# Patient Record
Sex: Male | Born: 1975 | Race: Black or African American | Hispanic: No | Marital: Single | Smoking: Current some day smoker
Health system: Southern US, Community
[De-identification: ages and names within clinical notes are randomized; demographics above are authoritative.]

## PROBLEM LIST (undated history)

## (undated) DIAGNOSIS — W3400XA Accidental discharge from unspecified firearms or gun, initial encounter: Secondary | ICD-10-CM

## (undated) DIAGNOSIS — F319 Bipolar disorder, unspecified: Secondary | ICD-10-CM

## (undated) DIAGNOSIS — B20 Human immunodeficiency virus [HIV] disease: Secondary | ICD-10-CM

## (undated) HISTORY — PX: OTHER SURGICAL HISTORY: SHX169

---

## 2013-08-08 ENCOUNTER — Emergency Department (HOSPITAL_COMMUNITY)
Admission: EM | Admit: 2013-08-08 | Discharge: 2013-08-08 | Disposition: A | Discharge status: Home / Self Care | Payer: Self-pay | Attending: Emergency Medicine | Admitting: Emergency Medicine

## 2013-08-08 ENCOUNTER — Encounter (HOSPITAL_COMMUNITY): Payer: Self-pay | Admitting: Adult Health

## 2013-08-08 DIAGNOSIS — F17200 Nicotine dependence, unspecified, uncomplicated: Secondary | ICD-10-CM | POA: Insufficient documentation

## 2013-08-08 DIAGNOSIS — Z87828 Personal history of other (healed) physical injury and trauma: Secondary | ICD-10-CM | POA: Insufficient documentation

## 2013-08-08 DIAGNOSIS — Z202 Contact with and (suspected) exposure to infections with a predominantly sexual mode of transmission: Secondary | ICD-10-CM | POA: Insufficient documentation

## 2013-08-08 HISTORY — DX: Accidental discharge from unspecified firearms or gun, initial encounter: W34.00XA

## 2013-08-08 MED ORDER — METRONIDAZOLE 500 MG PO TABS
2000.0000 mg | ORAL_TABLET | Freq: Once | ORAL | Status: AC
  Administered 2013-08-08: 2000 mg via ORAL
  Filled 2013-08-08: qty 4

## 2013-08-08 NOTE — ED Notes (Signed)
The pt wants to be checked for a std no distress

## 2013-08-08 NOTE — ED Notes (Signed)
Requesting STD check for trichamonas. Wife was recentlyu DX.
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
## 2013-08-09 NOTE — ED Provider Notes (Signed)
Medical screening examination/treatment/procedure(s) were performed by non-physician practitioner and as supervising physician I was immediately available for consultation/collaboration.  Brandt Loosen, MD 08/09/13 [phone]

## 2013-10-08 ENCOUNTER — Emergency Department (INDEPENDENT_AMBULATORY_CARE_PROVIDER_SITE_OTHER)
Admission: EM | Admit: 2013-10-08 | Discharge: 2013-10-08 | Disposition: A | Discharge status: Home / Self Care | Payer: Self-pay | Source: Home / Self Care | Attending: Family Medicine | Admitting: Family Medicine

## 2013-10-08 ENCOUNTER — Encounter (HOSPITAL_COMMUNITY): Payer: Self-pay | Admitting: Emergency Medicine

## 2013-10-08 DIAGNOSIS — M79609 Pain in unspecified limb: Secondary | ICD-10-CM

## 2013-10-08 DIAGNOSIS — M79604 Pain in right leg: Secondary | ICD-10-CM

## 2013-10-08 MED ORDER — TRAMADOL HCL 50 MG PO TABS: 50.0000 mg | ORAL_TABLET | Freq: Three times a day (TID) | ORAL | Status: DC | PRN
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
## 2013-10-11 NOTE — ED Provider Notes (Signed)
Medical screening examination/treatment/procedure(s) were performed by a resident physician or non-physician practitioner and as the supervising physician I was immediately available for consultation/collaboration.  Clementeen Graham, MD    Rodolph Bong, MD 10/11/13 0900

## 2013-12-10 HISTORY — PX: HAND SURGERY: SHX662

## 2014-03-07 ENCOUNTER — Emergency Department (HOSPITAL_COMMUNITY): Payer: No Typology Code available for payment source

## 2014-03-07 ENCOUNTER — Emergency Department (HOSPITAL_COMMUNITY)
Admission: EM | Admit: 2014-03-07 | Discharge: 2014-03-07 | Disposition: A | Discharge status: Home / Self Care | Payer: Self-pay | Attending: Emergency Medicine | Admitting: Emergency Medicine

## 2014-03-07 ENCOUNTER — Encounter (HOSPITAL_COMMUNITY): Payer: Self-pay | Admitting: Emergency Medicine

## 2014-03-07 DIAGNOSIS — Z888 Allergy status to other drugs, medicaments and biological substances status: Secondary | ICD-10-CM | POA: Insufficient documentation

## 2014-03-07 DIAGNOSIS — F319 Bipolar disorder, unspecified: Secondary | ICD-10-CM | POA: Insufficient documentation

## 2014-03-07 DIAGNOSIS — M549 Dorsalgia, unspecified: Secondary | ICD-10-CM | POA: Insufficient documentation

## 2014-03-07 DIAGNOSIS — Y939 Activity, unspecified: Secondary | ICD-10-CM | POA: Insufficient documentation

## 2014-03-07 DIAGNOSIS — S139XXA Sprain of joints and ligaments of unspecified parts of neck, initial encounter: Secondary | ICD-10-CM | POA: Insufficient documentation

## 2014-03-07 DIAGNOSIS — S6980XA Other specified injuries of unspecified wrist, hand and finger(s), initial encounter: Secondary | ICD-10-CM | POA: Insufficient documentation

## 2014-03-07 DIAGNOSIS — F17200 Nicotine dependence, unspecified, uncomplicated: Secondary | ICD-10-CM | POA: Insufficient documentation

## 2014-03-07 DIAGNOSIS — S6990XA Unspecified injury of unspecified wrist, hand and finger(s), initial encounter: Principal | ICD-10-CM | POA: Insufficient documentation

## 2014-03-07 DIAGNOSIS — M79644 Pain in right finger(s): Secondary | ICD-10-CM

## 2014-03-07 DIAGNOSIS — S161XXA Strain of muscle, fascia and tendon at neck level, initial encounter: Secondary | ICD-10-CM

## 2014-03-07 HISTORY — DX: Bipolar disorder, unspecified: F31.9

## 2014-03-07 MED ORDER — HYDROCODONE-ACETAMINOPHEN 5-325 MG PO TABS
1.0000 | ORAL_TABLET | Freq: Once | ORAL | Status: AC
  Administered 2014-03-07: 1 via ORAL
  Filled 2014-03-07: qty 1

## 2014-03-07 MED ORDER — OXYCODONE-ACETAMINOPHEN 5-325 MG PO TABS: 1.0000 | ORAL_TABLET | ORAL | Status: DC | PRN

## 2014-03-07 MED ORDER — METHOCARBAMOL 500 MG PO TABS: 500.0000 mg | ORAL_TABLET | Freq: Two times a day (BID) | ORAL | Status: DC

## 2014-03-07 MED ORDER — HYDROCODONE-ACETAMINOPHEN 5-325 MG PO TABS: 1.0000 | ORAL_TABLET | ORAL | Status: DC | PRN

## 2014-03-07 MED ORDER — METHOCARBAMOL 500 MG PO TABS
500.0000 mg | ORAL_TABLET | Freq: Once | ORAL | Status: AC
  Administered 2014-03-07: 500 mg via ORAL
  Filled 2014-03-07: qty 1

## 2014-03-07 NOTE — Discharge Instructions (Signed)
1. Medications: robaxin, vicodin, usual home medications 2. Treatment: rest, drink plenty of fluids, gentle stretching as discussed, alternate ice and heat, wear the brace as needed for comfort 3. Follow Up: Please followup with your primary doctor for discussion of your diagnoses and further evaluation after today's visit; if you do not have a primary care doctor use the resource guide provided to find one;    Cervical Sprain A cervical sprain is an injury in the neck in which the strong, fibrous tissues (ligaments) that connect your neck bones stretch or tear. Cervical sprains can range from mild to severe. Severe cervical sprains can cause the neck vertebrae to be unstable. This can lead to damage of the spinal cord and can result in serious nervous system problems. The amount of time it takes for a cervical sprain to get better depends on the cause and extent of the injury. Most cervical sprains heal in 1 to 3 weeks. CAUSES  Severe cervical sprains may be caused by:   Contact sport injuries (such as from football, rugby, wrestling, hockey, auto racing, gymnastics, diving, martial arts, or boxing).   Motor vehicle collisions.   Whiplash injuries. This is an injury from a sudden forward-and backward whipping movement of the head and neck.  Falls.  Mild cervical sprains may be caused by:   Being in an awkward position, such as while cradling a telephone between your ear and shoulder.   Sitting in a chair that does not offer proper support.   Working at a poorly Marketing executive station.   Looking up or down for long periods of time.  SYMPTOMS   Pain, soreness, stiffness, or a burning sensation in the front, back, or sides of the neck. This discomfort may develop immediately after the injury or slowly, 24 hours or more after the injury.   Pain or tenderness directly in the middle of the back of the neck.   Shoulder or upper back pain.   Limited ability to move the neck.    Headache.   Dizziness.   Weakness, numbness, or tingling in the hands or arms.   Muscle spasms.   Difficulty swallowing or chewing.   Tenderness and swelling of the neck.  DIAGNOSIS  Most of the time your health care provider can diagnose a cervical sprain by taking your history and doing a physical exam. Your health care provider will ask about previous neck injuries and any known neck problems, such as arthritis in the neck. X-rays may be taken to find out if there are any other problems, such as with the bones of the neck. Other tests, such as a CT scan or MRI, may also be needed.  TREATMENT  Treatment depends on the severity of the cervical sprain. Mild sprains can be treated with rest, keeping the neck in place (immobilization), and pain medicines. Severe cervical sprains are immediately immobilized. Further treatment is done to help with pain, muscle spasms, and other symptoms and may include:  Medicines, such as pain relievers, numbing medicines, or muscle relaxants.   Physical therapy. This may involve stretching exercises, strengthening exercises, and posture training. Exercises and improved posture can help stabilize the neck, strengthen muscles, and help stop symptoms from returning.  HOME CARE INSTRUCTIONS   Put ice on the injured area.   Put ice in a plastic bag.   Place a towel between your skin and the bag.   Leave the ice on for 15 20 minutes, 3 4 times a day.   If  your injury was severe, you may have been given a cervical collar to wear. A cervical collar is a two-piece collar designed to keep your neck from moving while it heals.  Do not remove the collar unless instructed by your health care provider.  If you have long hair, keep it outside of the collar.  Ask your health care provider before making any adjustments to your collar. Minor adjustments may be required over time to improve comfort and reduce pressure on your chin or on the back of  your head.  Ifyou are allowed to remove the collar for cleaning or bathing, follow your health care provider's instructions on how to do so safely.  Keep your collar clean by wiping it with mild soap and water and drying it completely. If the collar you have been given includes removable pads, remove them every 1 2 days and hand wash them with soap and water. Allow them to air dry. They should be completely dry before you wear them in the collar.  If you are allowed to remove the collar for cleaning and bathing, wash and dry the skin of your neck. Check your skin for irritation or sores. If you see any, tell your health care provider.  Do not drive while wearing the collar.   Only take over-the-counter or prescription medicines for pain, discomfort, or fever as directed by your health care provider.   Keep all follow-up appointments as directed by your health care provider.   Keep all physical therapy appointments as directed by your health care provider.   Make any needed adjustments to your workstation to promote good posture.   Avoid positions and activities that make your symptoms worse.   Warm up and stretch before being active to help prevent problems.  SEEK MEDICAL CARE IF:   Your pain is not controlled with medicine.   You are unable to decrease your pain medicine over time as planned.   Your activity level is not improving as expected.  SEEK IMMEDIATE MEDICAL CARE IF:   You develop any bleeding.  You develop stomach upset.  You have signs of an allergic reaction to your medicine.   Your symptoms get worse.   You develop new, unexplained symptoms.   You have numbness, tingling, weakness, or paralysis in any part of your body.  MAKE SURE YOU:   Understand these instructions.  Will watch your condition.  Will get help right away if you are not doing well or get worse. Document Released: 09/23/2007 Document Revised: 09/16/2013 Document Reviewed:  06/03/2013 [facility] Patient Information 2014 [employer], [place].
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
## 2014-03-07 NOTE — ED Notes (Signed)
Thumb spica is placed. Will prepare for discharge.
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
## 2014-03-07 NOTE — ED Provider Notes (Signed)
Medical screening examination/treatment/procedure(s) were performed by non-physician practitioner and as supervising physician I was immediately available for consultation/collaboration.     Reegan Mctighe, MD 03/07/14 2322 

## 2014-03-07 NOTE — Progress Notes (Signed)
Orthopedic Tech Progress Note Patient Details:  John Dixon 1976/04/22 [id number]  Ortho Devices Type of Ortho Device: Thumb velcro splint Ortho Device/Splint Location: rue Ortho Device/Splint Interventions: Application   [name] 03/07/2014, 7:10 PM

## 2014-04-21 ENCOUNTER — Encounter (HOSPITAL_COMMUNITY): Payer: Self-pay | Admitting: Emergency Medicine

## 2014-04-21 ENCOUNTER — Emergency Department (HOSPITAL_COMMUNITY)
Admission: EM | Admit: 2014-04-21 | Discharge: 2014-04-21 | Disposition: A | Discharge status: Home / Self Care | Payer: No Typology Code available for payment source | Attending: Emergency Medicine | Admitting: Emergency Medicine

## 2014-04-21 DIAGNOSIS — F17200 Nicotine dependence, unspecified, uncomplicated: Secondary | ICD-10-CM | POA: Insufficient documentation

## 2014-04-21 DIAGNOSIS — Z87828 Personal history of other (healed) physical injury and trauma: Secondary | ICD-10-CM | POA: Insufficient documentation

## 2014-04-21 DIAGNOSIS — F319 Bipolar disorder, unspecified: Secondary | ICD-10-CM | POA: Insufficient documentation

## 2014-04-21 DIAGNOSIS — Z79899 Other long term (current) drug therapy: Secondary | ICD-10-CM | POA: Insufficient documentation

## 2014-04-21 DIAGNOSIS — Z202 Contact with and (suspected) exposure to infections with a predominantly sexual mode of transmission: Secondary | ICD-10-CM | POA: Insufficient documentation

## 2014-04-21 DIAGNOSIS — Z711 Person with feared health complaint in whom no diagnosis is made: Secondary | ICD-10-CM

## 2014-04-21 MED ORDER — CEFTRIAXONE SODIUM 250 MG IJ SOLR
250.0000 mg | Freq: Once | INTRAMUSCULAR | Status: AC
  Administered 2014-04-21: 250 mg via INTRAMUSCULAR
  Filled 2014-04-21: qty 250

## 2014-04-21 MED ORDER — AZITHROMYCIN 250 MG PO TABS
1000.0000 mg | ORAL_TABLET | Freq: Once | ORAL | Status: AC
  Administered 2014-04-21: 1000 mg via ORAL
  Filled 2014-04-21: qty 4

## 2014-04-21 MED ORDER — LIDOCAINE HCL (PF) 1 % IJ SOLN
INTRAMUSCULAR | Status: AC
  Administered 2014-04-21: 5 mL
  Filled 2014-04-21: qty 5
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
## 2014-04-21 NOTE — Discharge Instructions (Signed)
Gonorrhea Gonorrhea is an infection that can cause serious problems. If left untreated, may   Damage the male or male organs.   Cause women to be unable to have children (sterility).   Harm a fetus, if the infected woman is pregnant.  It is important to get treatment for gonorrhea as soon as possible. It is also necessary that all your sexual partners be tested for the infection.  CAUSES  Gonorrhea is caused by bacteria called Neisseria gonorrhoeae. The infection is spread from person to person, usually by sexual contact (such as by anal, vaginal, or oral means). A newborn can contract the infection from his or her mother during birth.  SYMPTOMS  Some people with gonorrhea do not have symptoms. Symptoms may be different in females and males.  Females The most common symptoms are:   Pain in the lower abdomen.   Fever with or without chills.  Other symptoms include:   Abnormal vaginal discharge.   Painful intercourse.   Burning or itching of the vagina or lips of the vagina.   Abnormal vaginal bleeding.   Pain when urinating.   Long-lasting (chronic) pain in the lower abdomen, especially during menstruation or intercourse.   Inability to become pregnant.   Going into premature labor.   Irritation, pain, bleeding, or discharge from the rectum. This may occur if the infection was spread by anal sex.   Sore throat or swollen neck lymph nodes. This may occur if the infection was spread by oral sex.  Males The most common symptoms are:   Discharge from the penis.   Pain or burning during urination.   Pain or swelling in the testicles. Other symptoms may include:   Irritation, pain, bleeding, or discharge from the rectum. This may occur if the infection was spread by anal sex.   Sore throat, fever, or swollen neck lymph nodes. This may occur if the infection was spread by oral sex.  DIAGNOSIS  A diagnosis is made after a physical exam is done and a  sample of discharge is examined under a microscope for the presence of the bacteria. The discharge may be taken from the urethra, cervix, throat, or rectum.  TREATMENT  Gonorrhea is treated with antibiotic medicines. It is important for treatment to begin as soon as possible. Early treatment may prevent some problems from developing.  HOME CARE INSTRUCTIONS   Only take over-the-counter or prescription medicines for pain, fever, or discomfort as directed by your health care provider.   Take antibiotics as directed. Make sure you finish them even if you start to feel better. Incomplete treatment will put you at risk for continued infection.   Do not have sex until treatment is complete or as directed by your health care provider.   Follow up with your health care provider as directed.   Not all test results are available during your visit. If your test results are not back during the visit, make an appointment with your health care provider to find out the results. Do not assume everything is normal if you have not heard from your health care provider or the medical facility. It is important for you to follow up on all of your test results.   If you test positive for gonorrhea, inform your recent sexual partners. They need to be checked for gonorrhea even if they do not have symptoms. They may need treatment, even if they test negative for gonorrhea.  SEEK MEDICAL CARE IF:   You develop any bad  reaction to the medicine you were prescribed. This may include:   A rash.   Nausea.   Vomiting.   Diarrhea.   Your symptoms do not improve after a few days of taking antibiotics.   Your symptoms get worse.   You develop increased pain, such as in the testicles (for males) or in the abdomen (for females).  SEEK IMMEDIATE MEDICAL CARE IF:  You have a fever or persistent symptoms for more than 2 3 days.   You have a fever and your symptoms suddenly get worse.  MAKE SURE YOU:     Understand these instructions.  Will watch your condition.  Will get help right away if you are not doing well or get worse. Document Released: 11/23/2000 Document Revised: 09/16/2013 Document Reviewed: 06/03/2013 [facility] Patient Information 2014 [place], [place].

## 2014-04-21 NOTE — ED Notes (Signed)
Pt presents to department for evaluation of possible STD. States girlfriend tested positive for gonorrhea. Denies symptoms at the time. Pt is alert and oriented x4. NAD.

## 2014-04-25 NOTE — ED Provider Notes (Signed)
Medical screening examination/treatment/procedure(s) were performed by non-physician practitioner and as supervising physician I was immediately available for consultation/collaboration.   Layn Kye T Reda Citron, MD 04/25/14 0852 

## 2014-06-09 ENCOUNTER — Encounter (HOSPITAL_COMMUNITY): Payer: Self-pay | Admitting: Emergency Medicine

## 2014-06-09 ENCOUNTER — Emergency Department (HOSPITAL_COMMUNITY)
Admission: EM | Admit: 2014-06-09 | Discharge: 2014-06-09 | Disposition: A | Discharge status: Home / Self Care | Payer: No Typology Code available for payment source | Attending: Emergency Medicine | Admitting: Emergency Medicine

## 2014-06-09 DIAGNOSIS — M79609 Pain in unspecified limb: Secondary | ICD-10-CM | POA: Insufficient documentation

## 2014-06-09 DIAGNOSIS — F17200 Nicotine dependence, unspecified, uncomplicated: Secondary | ICD-10-CM | POA: Insufficient documentation
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
## 2014-06-09 NOTE — ED Notes (Signed)
Pt left room before being evaluated by PA.

## 2014-07-05 ENCOUNTER — Emergency Department (HOSPITAL_COMMUNITY): Payer: Self-pay

## 2014-07-05 ENCOUNTER — Encounter (HOSPITAL_COMMUNITY): Payer: Self-pay | Admitting: Emergency Medicine

## 2014-07-05 ENCOUNTER — Emergency Department (HOSPITAL_COMMUNITY)
Admission: EM | Admit: 2014-07-05 | Discharge: 2014-07-05 | Disposition: A | Discharge status: Home / Self Care | Payer: Self-pay | Attending: Emergency Medicine | Admitting: Emergency Medicine

## 2014-07-05 DIAGNOSIS — Z23 Encounter for immunization: Secondary | ICD-10-CM | POA: Insufficient documentation

## 2014-07-05 DIAGNOSIS — W268XXA Contact with other sharp object(s), not elsewhere classified, initial encounter: Secondary | ICD-10-CM | POA: Insufficient documentation

## 2014-07-05 DIAGNOSIS — Y9389 Activity, other specified: Secondary | ICD-10-CM | POA: Insufficient documentation

## 2014-07-05 DIAGNOSIS — Y9289 Other specified places as the place of occurrence of the external cause: Secondary | ICD-10-CM | POA: Insufficient documentation

## 2014-07-05 DIAGNOSIS — F17200 Nicotine dependence, unspecified, uncomplicated: Secondary | ICD-10-CM | POA: Insufficient documentation

## 2014-07-05 DIAGNOSIS — S61209A Unspecified open wound of unspecified finger without damage to nail, initial encounter: Secondary | ICD-10-CM | POA: Insufficient documentation

## 2014-07-05 DIAGNOSIS — S61219A Laceration without foreign body of unspecified finger without damage to nail, initial encounter: Secondary | ICD-10-CM

## 2014-07-05 MED ORDER — HYDROCODONE-ACETAMINOPHEN 5-325 MG PO TABS: 1.0000 | ORAL_TABLET | ORAL | Status: DC | PRN

## 2014-07-05 MED ORDER — CEPHALEXIN 500 MG PO CAPS: 500.0000 mg | ORAL_CAPSULE | Freq: Four times a day (QID) | ORAL | Status: DC

## 2014-07-05 MED ORDER — ONDANSETRON 8 MG PO TBDP
8.0000 mg | ORAL_TABLET | Freq: Once | ORAL | Status: AC
  Administered 2014-07-05: 8 mg via ORAL
  Filled 2014-07-05: qty 1

## 2014-07-05 MED ORDER — TETANUS-DIPHTH-ACELL PERTUSSIS 5-2.5-18.5 LF-MCG/0.5 IM SUSP
0.5000 mL | Freq: Once | INTRAMUSCULAR | Status: AC
  Administered 2014-07-05: 0.5 mL via INTRAMUSCULAR
  Filled 2014-07-05: qty 0.5

## 2014-07-05 MED ORDER — BACITRACIN 500 UNIT/GM EX OINT
1.0000 "application " | TOPICAL_OINTMENT | Freq: Two times a day (BID) | CUTANEOUS | Status: DC
  Administered 2014-07-05: 1 via TOPICAL
  Filled 2014-07-05 (×2): qty 0.9

## 2014-07-05 MED ORDER — OXYCODONE-ACETAMINOPHEN 5-325 MG PO TABS
2.0000 | ORAL_TABLET | Freq: Once | ORAL | Status: AC
  Administered 2014-07-05: 2 via ORAL
  Filled 2014-07-05: qty 2

## 2014-07-05 NOTE — Discharge Instructions (Signed)
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
## 2014-07-05 NOTE — ED Provider Notes (Signed)
Medical screening examination/treatment/procedure(s) were conducted as a shared visit with non-physician practitioner(s) and myself.  I personally evaluated the patient during the encounter.   EKG Interpretation None      38 yo male with injury to left index finger.  He was using a guidewire for a telephone pole to balance while walking down an incline, and it cut his finger.  On exam, well appearing, nontoxic, not distressed, normal respiratory effort, normal perfusion, left index finger with large U shaped laceration.  Bleeding controlled.  Tendon function, distal perfusion, and sensation intact.  Plan repair and anticipate dc.   Clinical Impression: 1. Finger laceration, initial encounter       Candyce ChurnJohn David Hugo Lybrand III, MD 07/05/14 1434

## 2014-07-05 NOTE — ED Notes (Signed)
11:12 vitals are not correct, placed on wrong patient.

## 2014-07-05 NOTE — ED Notes (Signed)
Patient was educated not to drive, operate heavy machinery, or drink alcohol while taking narcotic medication.  

## 2014-07-05 NOTE — ED Notes (Signed)
Pt state that he was grabbing on to wire that extends to the ground from power line while he was going down a hill and cut left index finger. Pt not sure when he last had Tetanus shot

## 2014-07-05 NOTE — Progress Notes (Signed)
[facility] provided pt with a list of primary care resources to help patient establish primary care.  °

## 2014-07-12 ENCOUNTER — Emergency Department (HOSPITAL_COMMUNITY)
Admission: EM | Admit: 2014-07-12 | Discharge: 2014-07-12 | Disposition: A | Discharge status: Home / Self Care | Payer: Self-pay | Attending: Emergency Medicine | Admitting: Emergency Medicine

## 2014-07-12 ENCOUNTER — Encounter (HOSPITAL_COMMUNITY): Payer: Self-pay | Admitting: Emergency Medicine

## 2014-07-12 DIAGNOSIS — Z87828 Personal history of other (healed) physical injury and trauma: Secondary | ICD-10-CM | POA: Insufficient documentation

## 2014-07-12 DIAGNOSIS — Z9889 Other specified postprocedural states: Secondary | ICD-10-CM | POA: Insufficient documentation

## 2014-07-12 DIAGNOSIS — M25549 Pain in joints of unspecified hand: Secondary | ICD-10-CM | POA: Insufficient documentation

## 2014-07-12 DIAGNOSIS — Z8659 Personal history of other mental and behavioral disorders: Secondary | ICD-10-CM | POA: Insufficient documentation

## 2014-07-12 DIAGNOSIS — Z4802 Encounter for removal of sutures: Secondary | ICD-10-CM | POA: Insufficient documentation

## 2014-07-12 DIAGNOSIS — Z792 Long term (current) use of antibiotics: Secondary | ICD-10-CM | POA: Insufficient documentation

## 2014-07-12 DIAGNOSIS — F17200 Nicotine dependence, unspecified, uncomplicated: Secondary | ICD-10-CM | POA: Insufficient documentation

## 2014-07-12 NOTE — Discharge Instructions (Signed)
Keep area clean with soap and warm water.  May apply neosporin and keep bandaged for the next few days. Recommend using mederma to help with scarring once healed. Return to the ED for new or worsening symptoms.
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
## 2014-07-12 NOTE — ED Notes (Signed)
Pt had a left index finger injury and had it sutured last Monday.  Site ? Infection, came in to have sutures removed.  Pt has some feeling in finger.

## 2014-07-13 NOTE — ED Provider Notes (Signed)
Medical screening examination/treatment/procedure(s) were performed by non-physician practitioner and as supervising physician I was immediately available for consultation/collaboration.   EKG Interpretation None       Doug SouSam Tyqwan Pink, MD 07/13/14 [id number]

## 2014-07-14 NOTE — ED Provider Notes (Signed)
Medical screening examination/treatment/procedure(s) were conducted as a shared visit with non-physician practitioner(s) and myself.  I personally evaluated the patient during the encounter.   Please see my separate note.     Candyce ChurnJohn David Liany Mumpower III, MD 07/14/14 2133

## 2014-08-22 ENCOUNTER — Emergency Department (HOSPITAL_COMMUNITY)
Admission: EM | Admit: 2014-08-22 | Discharge: 2014-08-22 | Disposition: A | Discharge status: Home / Self Care | Payer: No Typology Code available for payment source | Attending: Emergency Medicine | Admitting: Emergency Medicine

## 2014-08-22 ENCOUNTER — Encounter (HOSPITAL_COMMUNITY): Payer: Self-pay | Admitting: Emergency Medicine

## 2014-08-22 DIAGNOSIS — L989 Disorder of the skin and subcutaneous tissue, unspecified: Secondary | ICD-10-CM

## 2014-08-22 DIAGNOSIS — N476 Balanoposthitis: Secondary | ICD-10-CM | POA: Insufficient documentation

## 2014-08-22 DIAGNOSIS — Z87891 Personal history of nicotine dependence: Secondary | ICD-10-CM | POA: Insufficient documentation

## 2014-08-22 DIAGNOSIS — N508 Other specified disorders of male genital organs: Secondary | ICD-10-CM | POA: Insufficient documentation

## 2014-08-22 DIAGNOSIS — N481 Balanitis: Secondary | ICD-10-CM

## 2014-08-22 DIAGNOSIS — Z8659 Personal history of other mental and behavioral disorders: Secondary | ICD-10-CM | POA: Insufficient documentation

## 2014-08-22 DIAGNOSIS — Z792 Long term (current) use of antibiotics: Secondary | ICD-10-CM | POA: Insufficient documentation

## 2014-08-22 LAB — CBG MONITORING, ED: GLUCOSE-CAPILLARY: 88 mg/dL (ref 70–99)

## 2014-08-22 MED ORDER — CLOTRIMAZOLE 1 % EX CREA: TOPICAL_CREAM | CUTANEOUS | Status: DC

## 2014-08-22 NOTE — ED Provider Notes (Signed)
Medical screening examination/treatment/procedure(s) were performed by non-physician practitioner and as supervising physician I was immediately available for consultation/collaboration.   EKG Interpretation None        Camran Keady, MD 08/22/14 2318 

## 2014-08-22 NOTE — Discharge Instructions (Signed)
Read the information below.  Use the prescribed medication as directed.  Please discuss all new medications with your pharmacist.  You may return to the Emergency Department at any time for worsening condition or any new symptoms that concern you.  If you develop high fevers, abdominal or testicular pain, uncontrolled vomiting, are unable to move foreskin, or are unable to tolerate fluids by mouth, return to the ER for a recheck.      Balanitis Balanitis is inflammation of the head of the penis (glans).  CAUSES  Balanitis has multiple causes, both infectious and noninfectious. Often balanitis is the result of poor personal hygiene, especially in uncircumcised males. Without adequate washing, viruses, bacteria, and yeast collect between the foreskin and the glans. This can cause an infection. Lack of air and irritation from a normal secretion called smegma contribute to the cause in uncircumcised males. Other causes include:  Chemical irritation from the use of certain soaps and shower gels (especially soaps with perfumes), condoms, personal lubricants, petroleum jelly, spermicides, and fabric conditioners.  Skin conditions, such as eczema, dermatitis, and psoriasis.  Allergies to drugs, such as tetracycline and sulfa.  Certain medical conditions, including liver cirrhosis, congestive heart failure, and kidney disease.  Morbid obesity. RISK FACTORS  Diabetes mellitus.  A tight foreskin that is difficult to pull back past the glans (phimosis).  Sex without the use of a condom. SIGNS AND SYMPTOMS  Symptoms may include:  Discharge coming from under the foreskin.  Tenderness.  Itching and inability to get an erection (because of the pain).  Redness and a rash.  Sores on the glans and on the foreskin. DIAGNOSIS Diagnosis of balanitis is confirmed through a physical exam. TREATMENT The treatment is based on the cause of the balanitis. Treatment may include:  Frequent  cleansing.  Keeping the glans and foreskin dry.  Use of medicines such as creams, pain medicines, antibiotics, or medicines to treat fungal infections.  Sitz baths. If the irritation has caused a scar on the foreskin that prevents easy retraction, a circumcision may be recommended.  HOME CARE INSTRUCTIONS  Sex should be avoided until the condition has cleared. MAKE SURE YOU:  Understand these instructions.  Will watch your condition.  Will get help right away if you are not doing well or get worse. Document Released: 04/14/2009 Document Revised: 12/01/2013 Document Reviewed: 05/18/2013 [facility] Patient Information 2015 [place], [place]. This information is not intended to replace advice given to you by your health care provider. Make sure you discuss any questions you have with your health care provider.    Emergency Department Resource Guide 1) Find a Doctor and Pay Out of Pocket Although you won't have to find out who is covered by your insurance plan, it is a good idea to ask around and get recommendations. You will then need to call the office and see if the doctor you have chosen will accept you as a new patient and what types of options they offer for patients who are self-pay. Some doctors offer discounts or will set up payment plans for their patients who do not have insurance, but you will need to ask so you aren't surprised when you get to your appointment.  2) Contact Your Local Health Department Not all health departments have doctors that can see patients for sick visits, but many do, so it is worth a call to see if yours does. If you don't know where your local health department is, you can check in your phone book.  The CDC also has a tool to help you locate your state's health department, and many state websites also have listings of all of their local health departments.  3) Find a Walk-in Clinic If your illness is not likely to be very severe or complicated, you may  want to try a walk in clinic. These are popping up all over the country in pharmacies, drugstores, and shopping centers. They're usually staffed by nurse practitioners or physician assistants that have been trained to treat common illnesses and complaints. They're usually fairly quick and inexpensive. However, if you have serious medical issues or chronic medical problems, these are probably not your best option.  No Primary Care Doctor: - Call Health Connect at  [phone] - they can help you locate a primary care doctor that  accepts your insurance, provides certain services, etc. - Physician Referral Service- [phone]  Chronic Pain Problems: Organization         Address  Phone   Notes  [facility]  [phone] Patients need to be referred by their primary care doctor.   Medication Assistance: Organization         Address  Phone   Notes  [facility] Medication [facility] [address] [place], [place] [address] [phone] --Must be a resident of [facility] -- Must have NO insurance coverage whatsoever (no Medicaid/ Medicare, etc.) -- The pt. MUST have a primary care doctor that directs their care regularly and follows them in the community   [facility]  [phone]   [employer]  [phone]    Agencies that provide inexpensive medical care: Organization         Address  Phone   Notes  [facility]  [phone]   [facility]    [phone]   [facility] [address] [place], [place] [address] [phone]   [facility] [address] [place] [phone]   Planned Parenthood    [phone]   [facility]    [phone]   [facility]  [address], [place] Phone:  [phone], Fax:  [phone] Hours of Operation:  9 am - 6 pm, M-F.  Also accepts Medicaid/Medicare and self-pay.   [facility]  [address], [place] Phone: [phone], Fax: [phone]. Hours of Operation:  8:30 am - 5:30 pm, M-F.  Also accepts Medicaid and self-pay.  [facility] [place] [address], [place] Phone: [phone]   [facility] [address] Natasha Bence [place], [place] [phone], Ext. [phone] Mondays & Thursdays: 7-9 AM.  First 15 patients are seen on a first come, first serve basis.    Medicaid-accepting [facility] Providers:  Organization         Address  Phone   Notes  [facility] [address], [place] [phone] Also accepts self-pay patients.  [facility] [address] Laurell Josephs [place], [place]  [phone]   [facility] [address], [place] [phone]   [facility] [address] [place] [phone]   Renaye Rakers [address], [place]   [phone] Only accepts [place] Access [place] patients after they have their name applied to their card.   Self-Pay (  no insurance) in [facility]:  Organization         Address  Phone   Notes  Sickle Cell Patients[facility] [address] [place], [place] [phone]   [facility] [address] [place], [place] [phone]   [facility] [place]  [address], [place] [phone]   [facility]/Dr. Osei-Bonsu  [address], [place] or [address], [place] [phone] Phone number for both [place] and [place] locations is the same.  [facility] [address] [place] [phone]   [facility] [address] [place] or [address] [phone]   [facility] [address], [place] [phone], phone; [phone], fax Sees patients 1st and 3rd Saturday of every month.  Must not qualify for public or private insurance (i.e. Medicaid, Medicare, [place] Health Choice, Veterans' Benefits)  Household income should be no more than 200% of the poverty level The clinic cannot treat you if you are pregnant or think you are pregnant  Sexually transmitted diseases are not treated at the clinic.    Dental Care: Organization         Address  Phone  Notes  [facility] Department of [facility] [address] [place], [place] [phone] Accepts children up to age 36 who are enrolled in [place] or [place] Health Choice; pregnant women with a Medicaid card; and children who have applied for Medicaid or [place] Health Choice, but were declined, whose parents can pay a reduced fee at time of service.  [facility] Department of [facility]  [address], [place] [phone] Accepts children up to age 13 who are enrolled in [place] or [place] Health Choice; pregnant women with a Medicaid card; and children who have applied for Medicaid or  Health Choice, but were declined, whose parents can pay a reduced fee at time of service.  [facility]  [address] [place], [place] [phone] Patients are seen by appointment only. Walk-ins are not accepted. [facility] will see patients 10 years of age and older. Monday - Tuesday (8am-5pm) Most Wednesdays (8:30-5pm) $30 per visit, cash only  [facility] Adult Dental Access PRO[facility]  [address], [facility] [phone] Patients are seen by appointment only. Walk-ins are not accepted. [facility] will see patients 77 years of age and older. One Wednesday Evening (Monthly: Volunteer Based).  $30 per visit, cash only  [employer]  [phone] for adults; Children under age 41, call Graduate Pediatric Dentistry at [phone]. Children aged 35-14, please call [phone] to request a pediatric application.  Dental services are provided in all areas of dental care including fillings, crowns and bridges, complete and partial dentures, implants, gum treatment, root canals, and extractions. Preventive care is also provided. Treatment is provided to both adults and children. Patients are selected via a lottery and there is often a waiting list.   [facility] [address] [place]  [phone] [email or web address]   [facility] [address] [place], [place] [phone], Ext. [phone] Second and Fourth Thursday of each month, opens at 6:30 AM; Clinic ends at 9 AM.  Patients are seen on a first-come first-served basis, and a limited number are  seen during each clinic.   [facility]  [address] Ether Griffins [place], [place] [phone]   Eligibility Requirements You must have lived in [place], [place], or [place] counties for at least the last three months.   You cannot be eligible for state or federal sponsored [place], including [employer], [place], or [employer].   You generally cannot be eligible for healthcare insurance through your employer.    How to apply: Eligibility screenings are held every Tuesday and Wednesday afternoon from 1:00 pm until 4:00 pm. You do not need an appointment for the interview!  [facility] [address] [place], [place] [phone]   [facility] Health Department  [phone]   [facility] Health Department  [phone]   [facility] Health Department  [phone]    Behavioral Health Resources in the Community: Intensive Outpatient Programs Organization         Address  Phone  Notes  [facility] [address] [place], [place] [phone]   [facility] Outpatient [address] [place], [place] [phone]   [facility] [address], [place], [place]  [phone]    [facility] [address]  [place], [place] [phone] or [phone]   Substance Abuse Resources Organization         Address  Phone  Notes  [facility]  [phone]   [facility]  [phone]   [facility] [place]  [phone]   Floydene Flock  [phone]   Residential & Outpatient Substance Abuse Program  [phone]   Psychological Services Organization         Address  Phone  Notes  [facility]  [phone]   [facility]  [phone]   [facility] [address] [place] [phone] or [phone]    Mobile Crisis Teams Organization         Address  Phone  Notes  [facility], Mobile Crisis Care Unit  [phone]   [facility]  [address] [place], [place] [phone]   Doristine Locks [address] [place] [phone]    Self-Help/Support Groups Organization         Address  Phone             Notes  [facility] [place] - variety of support groups  336- I7437963 Call for more information  Narcotics Anonymous (NA), [facility] [address], [employer] [place]  2 meetings at this location   Statistician         Address  Phone  Notes  [facility] [address],    [place]  [phone]   [facility]  [address], [place] [address], [place], [place] [phone]   [facility] [address] [place], [place] [phone] Admissions: 8am-3pm M-F  [facility] [address],    [place], [place] [phone]   [facility] [address] Starling Manns [place], [place] [phone]   [facility] [address],  [place], [place] [phone]   [facility] - Intensive Outpatient [address] Laurell Josephs [address], [place], [place] [phone]   [facility] ([facility] [address] [place]  [place], [place] [phone] or [phone]   [facility] ([facility]) [address] [place], [place] [phone] Accepts Medicaid  [facility] [address]  [place] [phone] Substance Abuse/Addiction Treatment  ° °[place] Behavioral Health Resources °Organization         Address  Phone  Notes  °[facility]  (888) [phone]   °Julie Brannon, PhD [address] [place], [place]   (336) [phone] or (336) 951-0000   °[facility]   [address] °[place], [place] ([phone]) [phone]   °[facility] [address], [place], [place] ([phone]) [phone] Insurance/Medicaid/sponsorship through [facility]  °[facility] [address]                                    [place], [place] ([phone]) [phone] Therapy/tele-psych/case  °[facility] [address]  ° [place], [place] ([phone]) [phone]    °Dr. Arfeen  ([phone]) [phone]   °[facility] [place]  [employer] [place] [facility] 1) [address], [place] °2) [address], [place] °3)  [address], [place] (336) [phone] °(336) [phone] ° °(336) [phone]   °[place] Child Abuse Hotline (336) 342-1394 or (336) 342-3537 (After Hours)    ° ° ° °
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
## 2014-08-22 NOTE — ED Notes (Signed)
Onset yesterday pt noticed blister on penis.  No drainage or pain.

## 2014-08-23 LAB — HIV ANTIBODY (ROUTINE TESTING W REFLEX): HIV: REACTIVE — AB

## 2014-08-23 LAB — HIV 1/2 CONFIRMATION
HIV-1 antibody: NEGATIVE
HIV-2 Ab: NEGATIVE

## 2014-08-23 LAB — GC/CHLAMYDIA PROBE AMP
CT PROBE, AMP APTIMA: NEGATIVE
GC PROBE AMP APTIMA: NEGATIVE

## 2014-08-23 LAB — RPR

## 2014-08-24 ENCOUNTER — Telehealth: Payer: Self-pay | Admitting: Infectious Diseases

## 2014-08-24 NOTE — Telephone Encounter (Signed)
Left message asking pt to call [phone] to make appt due to his recent ED visit. No other specific information left as was a shared voice mail.

## 2014-08-25 LAB — VIRAL CULTURE VIRC: Special Requests: NORMAL

## 2014-08-25 NOTE — Telephone Encounter (Signed)
Thanks Jeff.

## 2014-08-25 NOTE — Telephone Encounter (Signed)
Will have him DIS find him, he has not called clinic.

## 2014-08-28 LAB — HIV-1 RNA, QUALITATIVE, TMA: HIV-1 RNA, QUAL: DETECTED — AB

## 2014-08-30 ENCOUNTER — Telehealth: Payer: Self-pay | Admitting: *Deleted

## 2014-08-30 NOTE — Telephone Encounter (Signed)
Patient's wife called initially, returning Dr. Moshe Cipro call from 9/15.  RN told patient's wife that it is our policy to speak only with the patient.  She said he would call right back. Patient called back 5 minutes later. He was given an appointment for 9/22 with Dr. Drue Second for ED lab follow up.  As patient may have been referred to DIS, will alert [place] at [phone] if he does not come in on 9/22. Andree Coss, RN

## 2014-08-31 ENCOUNTER — Ambulatory Visit (INDEPENDENT_AMBULATORY_CARE_PROVIDER_SITE_OTHER): Payer: Self-pay | Admitting: Internal Medicine

## 2014-08-31 DIAGNOSIS — B20 Human immunodeficiency virus [HIV] disease: Secondary | ICD-10-CM

## 2014-09-01 NOTE — Progress Notes (Signed)
Patient ID: John Dixon, male   DOB: 1976-01-14, 38 y.o.   MRN: [id number]  37yo M who recently went to the ED for penile lesion that has improved with taking keflex. 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 This results was conveyed to him during this visit. He is recently married for hte past year. He has not previously ever tested for HIV. RF includes incarceration. He denies IVDU. We have disclosed this result to his wife as well. Who underwent partner hiv testing which was negative. She tested negative for hiv roughly 1 year ago as well.  [place] met with the state health dept contact tracing/epi person as well during this visit. We will have him meet with financial counselor for adap application since he has no insurance, in addition to getting intake interview with tammy.  We recommend that he uses condoms with his wife especially since he is not virologically controlled.  Spent 45 min with patient with greater than 50% in counseling

## 2014-09-02 ENCOUNTER — Ambulatory Visit (INDEPENDENT_AMBULATORY_CARE_PROVIDER_SITE_OTHER): Payer: Self-pay

## 2014-09-02 DIAGNOSIS — F3170 Bipolar disorder, currently in remission, most recent episode unspecified: Secondary | ICD-10-CM

## 2014-09-02 DIAGNOSIS — Z23 Encounter for immunization: Secondary | ICD-10-CM

## 2014-09-02 DIAGNOSIS — B20 Human immunodeficiency virus [HIV] disease: Secondary | ICD-10-CM

## 2014-09-02 DIAGNOSIS — Z113 Encounter for screening for infections with a predominantly sexual mode of transmission: Secondary | ICD-10-CM

## 2014-09-02 DIAGNOSIS — Z79899 Other long term (current) drug therapy: Secondary | ICD-10-CM

## 2014-09-02 LAB — CBC WITH DIFFERENTIAL/PLATELET
BASOS ABS: 0.1 10*3/uL (ref 0.0–0.1)
Basophils Relative: 2 % — ABNORMAL HIGH (ref 0–1)
Eosinophils Absolute: 0 10*3/uL (ref 0.0–0.7)
Eosinophils Relative: 0 % (ref 0–5)
HEMATOCRIT: 44.1 % (ref 39.0–52.0)
HEMOGLOBIN: 15.2 g/dL (ref 13.0–17.0)
LYMPHS ABS: 2.6 10*3/uL (ref 0.7–4.0)
LYMPHS PCT: 50 % — AB (ref 12–46)
MCH: 28.8 pg (ref 26.0–34.0)
MCHC: 34.5 g/dL (ref 30.0–36.0)
MCV: 83.5 fL (ref 78.0–100.0)
MONO ABS: 0.8 10*3/uL (ref 0.1–1.0)
Monocytes Relative: 15 % — ABNORMAL HIGH (ref 3–12)
NEUTROS ABS: 1.7 10*3/uL (ref 1.7–7.7)
Neutrophils Relative %: 33 % — ABNORMAL LOW (ref 43–77)
Platelets: 180 10*3/uL (ref 150–400)
RBC: 5.28 MIL/uL (ref 4.22–5.81)
RDW: 14.1 % (ref 11.5–15.5)
WBC: 5.2 10*3/uL (ref 4.0–10.5)

## 2014-09-02 LAB — COMPLETE METABOLIC PANEL WITH GFR
ALBUMIN: 3.9 g/dL (ref 3.5–5.2)
ALT: 180 U/L — AB (ref 0–53)
AST: 101 U/L — AB (ref 0–37)
Alkaline Phosphatase: 62 U/L (ref 39–117)
BUN: 14 mg/dL (ref 6–23)
CALCIUM: 9.4 mg/dL (ref 8.4–10.5)
CHLORIDE: 106 meq/L (ref 96–112)
CO2: 24 mEq/L (ref 19–32)
Creat: 0.89 mg/dL (ref 0.50–1.35)
GFR, Est African American: >89 mL/min
GFR, Est Non African American: >89 mL/min
Glucose, Bld: 91 mg/dL (ref 70–99)
POTASSIUM: 3.9 meq/L (ref 3.5–5.3)
Sodium: 138 mEq/L (ref 135–145)
Total Bilirubin: 0.5 mg/dL (ref 0.2–1.2)
Total Protein: 7 g/dL (ref 6.0–8.3)

## 2014-09-02 LAB — LIPID PANEL
CHOLESTEROL: 197 mg/dL (ref 0–200)
HDL: 29 mg/dL — AB (ref >39)
LDL CALC: 137 mg/dL — AB (ref 0–99)
TRIGLYCERIDES: 157 mg/dL — AB (ref <150)
Total CHOL/HDL Ratio: 6.8 Ratio
VLDL: 31 mg/dL (ref 0–40)

## 2014-09-02 LAB — RPR

## 2014-09-03 LAB — URINALYSIS
BILIRUBIN URINE: NEGATIVE
Glucose, UA: NEGATIVE mg/dL
Hgb urine dipstick: NEGATIVE
KETONES UR: NEGATIVE mg/dL
Leukocytes, UA: NEGATIVE
NITRITE: NEGATIVE
PH: 6.5 (ref 5.0–8.0)
Protein, ur: NEGATIVE mg/dL
SPECIFIC GRAVITY, URINE: 1.022 (ref 1.005–1.030)
UROBILINOGEN UA: 1 mg/dL (ref 0.0–1.0)

## 2014-09-03 LAB — HEPATITIS C ANTIBODY: HCV AB: NEGATIVE

## 2014-09-03 LAB — HEPATITIS A ANTIBODY, TOTAL: Hep A Total Ab: REACTIVE — AB

## 2014-09-03 LAB — HEPATITIS B SURFACE ANTIBODY,QUALITATIVE: Hep B S Ab: NEGATIVE

## 2014-09-03 LAB — URINE CYTOLOGY ANCILLARY ONLY
CHLAMYDIA, DNA PROBE: NEGATIVE
NEISSERIA GONORRHEA: NEGATIVE

## 2014-09-03 LAB — HEPATITIS B SURFACE ANTIGEN: Hepatitis B Surface Ag: NEGATIVE

## 2014-09-03 LAB — T-HELPER CELL (CD4) - (RCID CLINIC ONLY)
CD4 % Helper T Cell: 33 % (ref 33–55)
CD4 T CELL ABS: 920 /uL (ref 400–2700)

## 2014-09-03 LAB — HEPATITIS B CORE ANTIBODY, TOTAL: Hep B Core Total Ab: NONREACTIVE

## 2014-09-05 LAB — QUANTIFERON TB GOLD ASSAY (BLOOD)
INTERFERON GAMMA RELEASE ASSAY: NEGATIVE
Mitogen value: 5.19 IU/mL
QUANTIFERON NIL VALUE: 0.08 [IU]/mL
Quantiferon Tb Ag Minus Nil Value: 0.01 IU/mL
TB AG VALUE: 0.09 [IU]/mL

## 2014-09-05 LAB — HIV-1 RNA ULTRAQUANT REFLEX TO GENTYP+
HIV 1 RNA Quant: 46138 copies/mL — ABNORMAL HIGH (ref <20)
HIV-1 RNA Quant, Log: 4.66 {Log} — ABNORMAL HIGH (ref <1.30)

## 2014-09-05 LAB — HIV-1 GENOTYPR PLUS

## 2014-09-08 LAB — HLA B*5701: HLA-B*5701 w/rflx HLA-B High: NEGATIVE
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
## 2014-09-22 ENCOUNTER — Ambulatory Visit (INDEPENDENT_AMBULATORY_CARE_PROVIDER_SITE_OTHER): Payer: Self-pay | Admitting: Internal Medicine

## 2014-09-22 ENCOUNTER — Encounter: Payer: Self-pay | Admitting: Internal Medicine

## 2014-09-22 VITALS — BP 136/85 | HR 66 | Temp 98.4°F | Wt 233.0 lb

## 2014-09-22 DIAGNOSIS — Z23 Encounter for immunization: Secondary | ICD-10-CM

## 2014-09-22 DIAGNOSIS — Z21 Asymptomatic human immunodeficiency virus [HIV] infection status: Secondary | ICD-10-CM

## 2014-09-22 MED ORDER — ELVITEG-COBIC-EMTRICIT-TENOFDF 150-150-200-300 MG PO TABS
1.0000 | ORAL_TABLET | Freq: Every day | ORAL | Status: DC
Start: 2014-09-22 — End: 2014-10-06

## 2014-09-22 NOTE — Progress Notes (Signed)
Patient ID: John Dixon, male   DOB: [birth year], 38 y.o.   MRN: [id number]       Patient ID: John OvensLameek Mccullum, male   DOB: [birth year], 38 y.o.   MRN: [id number]  HPI 37yo M with HIV disease, CD 4 count 900/VL 46,138. Genotype pending. ART naive. He states that he is doing ok since finding out he has hiv disease. His wife has been tested, and she remains negative. He is interested in starting treatment.  He is interested in quiting smoking  RF for hiv disease, likely sex with HIV+, but unaware of such interaction. Hx of incarceration  Outpatient Encounter Prescriptions as of 09/22/2014  Medication Sig  . cephALEXin (KEFLEX) 500 MG capsule Take 500 mg by mouth 4 (four) times daily. Started medication on Monday 07-05-14  . clotrimazole (LOTRIMIN) 1 % cream Apply to affected area 2 times daily until symptoms resolve     Pmhx: HIV, newly diagnosis  Health Maintenance Due  Topic Date Due  . Influenza Vaccine  07/10/2014    History  Substance Use Topics  . Smoking status: Current Every Day Smoker -- 0.50 packs/day for 4 years    Types: Cigarettes  . Smokeless tobacco: Never Used  . Alcohol Use: No  family history is not on file. Review of Systems Review of Systems  Constitutional: Negative for fever, chills, diaphoresis, activity change, appetite change, fatigue and unexpected weight change.  HENT: Negative for congestion, sore throat, rhinorrhea, sneezing, trouble swallowing and sinus pressure.  Eyes: Negative for photophobia and visual disturbance.  Respiratory: Negative for cough, chest tightness, shortness of breath, wheezing and stridor.  Cardiovascular: Negative for chest pain, palpitations and leg swelling.  Gastrointestinal: Negative for nausea, vomiting, abdominal pain, diarrhea, constipation, blood in stool, abdominal distention and anal bleeding.  Genitourinary: Negative for dysuria, hematuria, flank pain and difficulty urinating.  Musculoskeletal: Negative for myalgias, back  pain, joint swelling, arthralgias and gait problem.  Skin: Negative for color change, pallor, rash and wound.  Neurological: Negative for dizziness, tremors, weakness and light-headedness.  Hematological: Negative for adenopathy. Does not bruise/bleed easily.  Psychiatric/Behavioral: Negative for behavioral problems, confusion, sleep disturbance, dysphoric mood, decreased concentration and agitation.    Physical Exam   BP 136/85  Pulse 66  Temp(Src) 98.4 F (36.9 C) (Oral)  Wt 233 lb (105.688 kg)  Constitutional: He is oriented to person, place, and time. He appears well-developed and well-nourished. No distress.  HENT:  Mouth/Throat: Oropharynx is clear and moist. No oropharyngeal exudate.  Cardiovascular: Normal rate, regular rhythm and normal heart sounds. Exam reveals no gallop and no friction rub.  No murmur heard.  Pulmonary/Chest: Effort normal and breath sounds normal. No respiratory distress. He has no wheezes.  Abdominal: Soft. Bowel sounds are normal. He exhibits no distension. There is no tenderness.  Lymphadenopathy:  He has no cervical adenopathy.  Neurological: He is alert and oriented to person, place, and time.  Skin: Skin is warm and dry. No rash noted. No erythema.  Psychiatric: He has a normal mood and affect. His behavior is normal.    Lab Results  Component Value Date   CD4TCELL 33 09/02/2014   Lab Results  Component Value Date   CD4TABS 920 09/02/2014   Lab Results  Component Value Date   HIV1RNAQUANT [id number]* 09/02/2014   Lab Results  Component Value Date   HEPBSAB NEG 09/02/2014   No results found for this basename: RPR    CBC Lab Results  Component Value Date   WBC 5.2  09/02/2014   RBC 5.28 09/02/2014   HGB 15.2 09/02/2014   HCT 44.1 09/02/2014   PLT 180 09/02/2014   MCV 83.5 09/02/2014   MCH 28.8 09/02/2014   MCHC 34.5 09/02/2014   RDW 14.1 09/02/2014   LYMPHSABS 2.6 09/02/2014   MONOABS 0.8 09/02/2014   EOSABS 0.0 09/02/2014   BASOSABS 0.1  09/02/2014   BMET Lab Results  Component Value Date   NA 138 09/02/2014   K 3.9 09/02/2014   CL 106 09/02/2014   CO2 24 09/02/2014   GLUCOSE 91 09/02/2014   BUN 14 09/02/2014   CREATININE 0.89 09/02/2014   CALCIUM 9.4 09/02/2014   GFRNONAA >89 09/02/2014   GFRAA >89 09/02/2014     Assessment and Plan  hiv disease= will submit his adap application with rx for stribild. Anticipate to start in 2-3 wks. Reviewed importance of adherence and how to take medication  Health maintenance = will start hep B series, hep B #1 today; otherwise, uptodate on his vaccines  hiv prevention = discussed importance of condom use  Smoking cessation = will address at next visit

## 2014-09-27 LAB — HIV-1 GENOTYPE: HIV-1 Genotype: DETECTED

## 2014-10-06 ENCOUNTER — Other Ambulatory Visit: Payer: Self-pay | Admitting: *Deleted

## 2014-10-06 DIAGNOSIS — Z21 Asymptomatic human immunodeficiency virus [HIV] infection status: Secondary | ICD-10-CM

## 2014-10-06 MED ORDER — ELVITEG-COBIC-EMTRICIT-TENOFDF 150-150-200-300 MG PO TABS: 1.0000 | ORAL_TABLET | Freq: Every day | ORAL | Status: DC

## 2014-10-27 ENCOUNTER — Other Ambulatory Visit: Payer: Self-pay | Admitting: *Deleted

## 2014-10-27 DIAGNOSIS — Z21 Asymptomatic human immunodeficiency virus [HIV] infection status: Secondary | ICD-10-CM

## 2014-10-27 MED ORDER — ELVITEG-COBIC-EMTRICIT-TENOFDF 150-150-200-300 MG PO TABS: 1.0000 | ORAL_TABLET | Freq: Every day | ORAL | Status: DC

## 2014-11-11 ENCOUNTER — Ambulatory Visit (INDEPENDENT_AMBULATORY_CARE_PROVIDER_SITE_OTHER): Payer: Self-pay | Admitting: Internal Medicine

## 2014-11-11 ENCOUNTER — Encounter: Payer: Self-pay | Admitting: Internal Medicine

## 2014-11-11 VITALS — BP 144/92 | HR 59 | Temp 97.8°F | Wt 227.0 lb

## 2014-11-11 DIAGNOSIS — Z0000 Encounter for general adult medical examination without abnormal findings: Secondary | ICD-10-CM

## 2014-11-11 DIAGNOSIS — R030 Elevated blood-pressure reading, without diagnosis of hypertension: Secondary | ICD-10-CM

## 2014-11-11 DIAGNOSIS — Z23 Encounter for immunization: Secondary | ICD-10-CM

## 2014-11-11 DIAGNOSIS — B20 Human immunodeficiency virus [HIV] disease: Secondary | ICD-10-CM

## 2014-11-11 NOTE — Progress Notes (Signed)
Patient ID: John Dixon, male   DOB: 25-Jun-1976, 38 y.o.   MRN: [id number]       Patient ID: John OvensLameek Christner, male   DOB: 25-Jun-1976, 38 y.o.   MRN: [id number]  HPI Cd 4 count of 920/VL 46, 138, ART naive. Genotype has K103N,V179I, I62V,L63T,T74A,V77I. He has finally been approved for adap, to start stribild tomorrow. He is motivated to start treatment. Still using condoms with his wife. She was last tested in early October, negative at that time. No recent illnesses. In good spirits  Outpatient Encounter Prescriptions as of 11/11/2014  Medication Sig  . clotrimazole (LOTRIMIN) 1 % cream Apply to affected area 2 times daily until symptoms resolve  . elvitegravir-cobicistat-emtricitabine-tenofovir (STRIBILD) 150-150-200-300 MG TABS tablet Take 1 tablet by mouth daily with breakfast.  . cephALEXin (KEFLEX) 500 MG capsule Take 500 mg by mouth 4 (four) times daily. Started medication on Monday 07-05-14     Patient Active Problem List   Diagnosis Date Noted  . Asymptomatic HIV infection 09/22/2014     There are no preventive care reminders to display for this patient.   Review of Systems  Constitutional: Negative for fever, chills, diaphoresis, activity change, appetite change, fatigue and unexpected weight change.  HENT: Negative for congestion, sore throat, rhinorrhea, sneezing, trouble swallowing and sinus pressure.  Eyes: Negative for photophobia and visual disturbance.  Respiratory: Negative for cough, chest tightness, shortness of breath, wheezing and stridor.  Cardiovascular: Negative for chest pain, palpitations and leg swelling.  Gastrointestinal: Negative for nausea, vomiting, abdominal pain, diarrhea, constipation, blood in stool, abdominal distention and anal bleeding.  Genitourinary: Negative for dysuria, hematuria, flank pain and difficulty urinating.  Musculoskeletal: Negative for myalgias, back pain, joint swelling, arthralgias and gait problem.  Skin: Negative for color  change, pallor, rash and wound.  Neurological: Negative for dizziness, tremors, weakness and light-headedness.  Hematological: Negative for adenopathy. Does not bruise/bleed easily.  Psychiatric/Behavioral: Negative for behavioral problems, confusion, sleep disturbance, dysphoric mood, decreased concentration and agitation.    Physical Exam   BP 144/92 mmHg  Pulse 59  Temp(Src) 97.8 F (36.6 C) (Oral)  Wt 227 lb (102.967 kg)  Constitutional: He is oriented to person, place, and time. He appears well-developed and well-nourished. No distress.  HENT:  Mouth/Throat: Oropharynx is clear and moist. No oropharyngeal exudate.  Cardiovascular: Normal rate, regular rhythm and normal heart sounds. Exam reveals no gallop and no friction rub.  No murmur heard.  Pulmonary/Chest: Effort normal and breath sounds normal. No respiratory distress. He has no wheezes.  Abdominal: Soft. Bowel sounds are normal. He exhibits no distension. There is no tenderness.  Lymphadenopathy:  He has no cervical adenopathy.  Neurological: He is alert and oriented to person, place, and time.  Skin: Skin is warm and dry. No rash noted. No erythema.  Psychiatric: He has a normal mood and affect. His behavior is normal.    Lab Results  Component Value Date   CD4TCELL 33 09/02/2014   Lab Results  Component Value Date   CD4TABS 920 09/02/2014   Lab Results  Component Value Date   HIV1RNAQUANT [id number]* 09/02/2014   Lab Results  Component Value Date   HEPBSAB NEG 09/02/2014   No results found for: RPR  CBC Lab Results  Component Value Date   WBC 5.2 09/02/2014   RBC 5.28 09/02/2014   HGB 15.2 09/02/2014   HCT 44.1 09/02/2014   PLT 180 09/02/2014   MCV 83.5 09/02/2014   MCH 28.8 09/02/2014   MCHC 34.5  09/02/2014   RDW 14.1 09/02/2014   LYMPHSABS 2.6 09/02/2014   MONOABS 0.8 09/02/2014   EOSABS 0.0 09/02/2014   BASOSABS 0.1 09/02/2014   BMET Lab Results  Component Value Date   NA 138 09/02/2014    K 3.9 09/02/2014   CL 106 09/02/2014   CO2 24 09/02/2014   GLUCOSE 91 09/02/2014   BUN 14 09/02/2014   CREATININE 0.89 09/02/2014   CALCIUM 9.4 09/02/2014   GFRNONAA >89 09/02/2014   GFRAA >89 09/02/2014     Assessment and Plan hiv disease = will have him start stribild. Return in 6 wk to do labs and checking in with adherence  Health maintenance = he will need #2 hep B today  HTN = if still elevated at next visit, may need to start antihypertensive  hiv prevention = will test his wife today. Will also have them continue using condoms.  rtc in 6 wk

## 2014-12-22 ENCOUNTER — Ambulatory Visit (INDEPENDENT_AMBULATORY_CARE_PROVIDER_SITE_OTHER): Payer: Self-pay | Admitting: Internal Medicine

## 2014-12-22 ENCOUNTER — Encounter: Payer: Self-pay | Admitting: Internal Medicine

## 2014-12-22 VITALS — BP 130/81 | HR 60 | Temp 97.3°F | Wt 223.0 lb

## 2014-12-22 DIAGNOSIS — B20 Human immunodeficiency virus [HIV] disease: Secondary | ICD-10-CM

## 2014-12-22 LAB — COMPLETE METABOLIC PANEL WITH GFR
ALBUMIN: 4 g/dL (ref 3.5–5.2)
ALT: 41 U/L (ref 0–53)
AST: 26 U/L (ref 0–37)
Alkaline Phosphatase: 60 U/L (ref 39–117)
BILIRUBIN TOTAL: 0.3 mg/dL (ref 0.2–1.2)
BUN: 12 mg/dL (ref 6–23)
CHLORIDE: 105 meq/L (ref 96–112)
CO2: 26 mEq/L (ref 19–32)
Calcium: 9.6 mg/dL (ref 8.4–10.5)
Creat: 0.94 mg/dL (ref 0.50–1.35)
GFR, Est African American: >89 mL/min
GFR, Est Non African American: >89 mL/min
Glucose, Bld: 88 mg/dL (ref 70–99)
POTASSIUM: 4.7 meq/L (ref 3.5–5.3)
SODIUM: 138 meq/L (ref 135–145)
Total Protein: 7.2 g/dL (ref 6.0–8.3)

## 2014-12-22 LAB — CBC WITH DIFFERENTIAL/PLATELET
Basophils Absolute: 0 10*3/uL (ref 0.0–0.1)
Basophils Relative: 0 % (ref 0–1)
Eosinophils Absolute: 0 10*3/uL (ref 0.0–0.7)
Eosinophils Relative: 1 % (ref 0–5)
HCT: 46 % (ref 39.0–52.0)
Hemoglobin: 16 g/dL (ref 13.0–17.0)
LYMPHS PCT: 48 % — AB (ref 12–46)
Lymphs Abs: 2.2 10*3/uL (ref 0.7–4.0)
MCH: 29.2 pg (ref 26.0–34.0)
MCHC: 34.8 g/dL (ref 30.0–36.0)
MCV: 83.9 fL (ref 78.0–100.0)
MONO ABS: 0.6 10*3/uL (ref 0.1–1.0)
MONOS PCT: 13 % — AB (ref 3–12)
MPV: 10.3 fL (ref 8.6–12.4)
NEUTROS ABS: 1.7 10*3/uL (ref 1.7–7.7)
Neutrophils Relative %: 38 % — ABNORMAL LOW (ref 43–77)
PLATELETS: 190 10*3/uL (ref 150–400)
RBC: 5.48 MIL/uL (ref 4.22–5.81)
RDW: 14.7 % (ref 11.5–15.5)
WBC: 4.5 10*3/uL (ref 4.0–10.5)

## 2014-12-22 NOTE — Progress Notes (Signed)
Patient ID: John Dixon, male   DOB: [birth year], 39 y.o.   MRN: [id number]       Patient ID: John Dixon, male   DOB: [birth year], 39 y.o.   MRN: [id number]  HPI 38yo M recently diagnosed with HIV in the Fall 2015. CD 4 count of 920/VL46,000 in setting of new girlfriend/fiance/SO. He has now been on stribild for 4 weeks tolerating medication well without missing any doses. He did notice 1 wk of nausea but no vomiting and now symptoms resolved spontaneously. He is in good spirits. Committed to taking medications.  Outpatient Encounter Prescriptions as of 12/22/2014  Medication Sig  . elvitegravir-cobicistat-emtricitabine-tenofovir (STRIBILD) 150-150-200-300 MG TABS tablet Take 1 tablet by mouth daily with breakfast.  . cephALEXin (KEFLEX) 500 MG capsule Take 500 mg by mouth 4 (four) times daily. Started medication on Monday 07-05-14  . clotrimazole (LOTRIMIN) 1 % cream Apply to affected area 2 times daily until symptoms resolve (Patient not taking: Reported on 12/22/2014)     Patient Active Problem List   Diagnosis Date Noted  . Asymptomatic HIV infection 09/22/2014     There are no preventive care reminders to display for this patient.   Review of Systems Review of Systems  Constitutional: Negative for fever, chills, diaphoresis, activity change, appetite change, fatigue and unexpected weight change.  HENT: Negative for congestion, sore throat, rhinorrhea, sneezing, trouble swallowing and sinus pressure.  Eyes: Negative for photophobia and visual disturbance.  Respiratory: Negative for cough, chest tightness, shortness of breath, wheezing and stridor.  Cardiovascular: Negative for chest pain, palpitations and leg swelling.  Gastrointestinal: Negative for nausea, vomiting, abdominal pain, diarrhea, constipation, blood in stool, abdominal distention and anal bleeding.  Genitourinary: Negative for dysuria, hematuria, flank pain and difficulty urinating.  Musculoskeletal: Negative for  myalgias, back pain, joint swelling, arthralgias and gait problem.  Skin: Negative for color change, pallor, rash and wound.  Neurological: Negative for dizziness, tremors, weakness and light-headedness.  Hematological: Negative for adenopathy. Does not bruise/bleed easily.  Psychiatric/Behavioral: Negative for behavioral problems, confusion, sleep disturbance, dysphoric mood, decreased concentration and agitation.    Physical Exam   BP 130/81 mmHg  Pulse 60  Temp(Src) 97.3 F (36.3 C) (Oral)  Wt 223 lb (101.152 kg) Physical Exam  Constitutional: He is oriented to person, place, and time. He appears well-developed and well-nourished. No distress.  HENT:  Mouth/Throat: Oropharynx is clear and moist. No oropharyngeal exudate.  Lymphadenopathy:  He has no cervical adenopathy.  Neurological: He is alert and oriented to person, place, and time.  Skin: Skin is warm and dry. No rash noted. No erythema.  Psychiatric: He has a normal mood and affect. His behavior is normal.    Lab Results  Component Value Date   CD4TCELL 33 09/02/2014   Lab Results  Component Value Date   CD4TABS 920 09/02/2014   Lab Results  Component Value Date   HIV1RNAQUANT [id number]* 09/02/2014   Lab Results  Component Value Date   HEPBSAB NEG 09/02/2014   No results found for: RPR  CBC Lab Results  Component Value Date   WBC 5.2 09/02/2014   RBC 5.28 09/02/2014   HGB 15.2 09/02/2014   HCT 44.1 09/02/2014   PLT 180 09/02/2014   MCV 83.5 09/02/2014   MCH 28.8 09/02/2014   MCHC 34.5 09/02/2014   RDW 14.1 09/02/2014   LYMPHSABS 2.6 09/02/2014   MONOABS 0.8 09/02/2014   EOSABS 0.0 09/02/2014   BASOSABS 0.1 09/02/2014   BMET Lab Results  Component Value  Date   NA 138 09/02/2014   K 3.9 09/02/2014   CL 106 09/02/2014   CO2 24 09/02/2014   GLUCOSE 91 09/02/2014   BUN 14 09/02/2014   CREATININE 0.89 09/02/2014   CALCIUM 9.4 09/02/2014   GFRNONAA >89 09/02/2014   GFRAA >89 09/02/2014      Assessment and Plan  hiv = recently started stribild appears to be tolerating medication without difficulty. Will check cd 4 count and viral load today.  hiv prevention = in a discordant relationship, continue with condoms  rtc in 2.5 months

## 2014-12-23 LAB — T-HELPER CELL (CD4) - (RCID CLINIC ONLY)
CD4 % Helper T Cell: 37 % (ref 33–55)
CD4 T CELL ABS: 780 /uL (ref 400–2700)

## 2014-12-23 LAB — HIV-1 RNA QUANT-NO REFLEX-BLD

## 2015-02-16 ENCOUNTER — Other Ambulatory Visit (INDEPENDENT_AMBULATORY_CARE_PROVIDER_SITE_OTHER): Payer: Self-pay

## 2015-02-16 DIAGNOSIS — B20 Human immunodeficiency virus [HIV] disease: Secondary | ICD-10-CM

## 2015-02-16 LAB — CBC WITH DIFFERENTIAL/PLATELET
Basophils Absolute: 0 10*3/uL (ref 0.0–0.1)
Basophils Relative: 0 % (ref 0–1)
EOS ABS: 0 10*3/uL (ref 0.0–0.7)
Eosinophils Relative: 1 % (ref 0–5)
HCT: 45.6 % (ref 39.0–52.0)
Hemoglobin: 15.6 g/dL (ref 13.0–17.0)
LYMPHS ABS: 1.8 10*3/uL (ref 0.7–4.0)
LYMPHS PCT: 39 % (ref 12–46)
MCH: 29.3 pg (ref 26.0–34.0)
MCHC: 34.2 g/dL (ref 30.0–36.0)
MCV: 85.6 fL (ref 78.0–100.0)
MPV: 10.3 fL (ref 8.6–12.4)
Monocytes Absolute: 0.5 10*3/uL (ref 0.1–1.0)
Monocytes Relative: 11 % (ref 3–12)
NEUTROS ABS: 2.3 10*3/uL (ref 1.7–7.7)
NEUTROS PCT: 49 % (ref 43–77)
Platelets: 186 10*3/uL (ref 150–400)
RBC: 5.33 MIL/uL (ref 4.22–5.81)
RDW: 15.4 % (ref 11.5–15.5)
WBC: 4.7 10*3/uL (ref 4.0–10.5)

## 2015-02-16 LAB — COMPREHENSIVE METABOLIC PANEL
ALBUMIN: 3.9 g/dL (ref 3.5–5.2)
ALK PHOS: 59 U/L (ref 39–117)
ALT: 18 U/L (ref 0–53)
AST: 17 U/L (ref 0–37)
BUN: 13 mg/dL (ref 6–23)
CALCIUM: 9.3 mg/dL (ref 8.4–10.5)
CHLORIDE: 104 meq/L (ref 96–112)
CO2: 22 meq/L (ref 19–32)
Creat: 0.94 mg/dL (ref 0.50–1.35)
Glucose, Bld: 125 mg/dL — ABNORMAL HIGH (ref 70–99)
Potassium: 4.7 mEq/L (ref 3.5–5.3)
Sodium: 140 mEq/L (ref 135–145)
TOTAL PROTEIN: 6.8 g/dL (ref 6.0–8.3)
Total Bilirubin: 0.4 mg/dL (ref 0.2–1.2)

## 2015-02-17 ENCOUNTER — Other Ambulatory Visit: Payer: Self-pay | Admitting: *Deleted

## 2015-02-17 DIAGNOSIS — Z21 Asymptomatic human immunodeficiency virus [HIV] infection status: Secondary | ICD-10-CM

## 2015-02-17 LAB — T-HELPER CELL (CD4) - (RCID CLINIC ONLY)
CD4 % Helper T Cell: 41 % (ref 33–55)
CD4 T Cell Abs: 780 /uL (ref 400–2700)

## 2015-02-17 MED ORDER — ELVITEG-COBIC-EMTRICIT-TENOFDF 150-150-200-300 MG PO TABS: 1.0000 | ORAL_TABLET | Freq: Every day | ORAL | Status: DC

## 2015-02-17 NOTE — Telephone Encounter (Signed)
ADAP Application 

## 2015-02-18 LAB — HIV-1 RNA QUANT-NO REFLEX-BLD: HIV 1 RNA Quant: <20 copies/mL (ref <20)

## 2015-03-02 ENCOUNTER — Encounter: Payer: Self-pay | Admitting: Internal Medicine

## 2015-03-02 ENCOUNTER — Ambulatory Visit (INDEPENDENT_AMBULATORY_CARE_PROVIDER_SITE_OTHER): Payer: Self-pay | Admitting: Internal Medicine

## 2015-03-02 VITALS — BP 130/83 | HR 73 | Wt 229.8 lb

## 2015-03-02 DIAGNOSIS — B20 Human immunodeficiency virus [HIV] disease: Secondary | ICD-10-CM

## 2015-03-02 DIAGNOSIS — R030 Elevated blood-pressure reading, without diagnosis of hypertension: Secondary | ICD-10-CM

## 2015-03-02 DIAGNOSIS — Z0000 Encounter for general adult medical examination without abnormal findings: Secondary | ICD-10-CM

## 2015-03-02 NOTE — Progress Notes (Signed)
Patient ID: John Dixon, male   DOB: [birth year], 39 y.o.   MRN: [id number]       Patient ID: John OvensLameek Kiely, male   DOB: [birth year], 39 y.o.   MRN: [id number]  HPI 39yo M with HIV disease, CD 4 count of 780/VL<20 on stribild. Doing well with adherence. Doing well without missing dosese. He denies having any recent difficulties with his health. No fever, chills, nightsweats, abd pain, nausea, vomiting ,rash, headache  Outpatient Encounter Prescriptions as of 03/02/2015  Medication Sig  . cephALEXin (KEFLEX) 500 MG capsule Take 500 mg by mouth 4 (four) times daily. Started medication on Monday 07-05-14  . clotrimazole (LOTRIMIN) 1 % cream Apply to affected area 2 times daily until symptoms resolve (Patient not taking: Reported on 12/22/2014)  . elvitegravir-cobicistat-emtricitabine-tenofovir (STRIBILD) 150-150-200-300 MG TABS tablet Take 1 tablet by mouth daily with breakfast.     Patient Active Problem List   Diagnosis Date Noted  . Asymptomatic HIV infection 09/22/2014     There are no preventive care reminders to display for this patient.   Review of Systems 10 point ros is negative Physical Exam   BP 130/83 mmHg  Pulse 73  Wt 229 lb 12.8 oz (104.237 kg) Physical Exam  Constitutional: He is oriented to person, place, and time. He appears well-developed and well-nourished. No distress.  HENT:  Mouth/Throat: Oropharynx is clear and moist. No oropharyngeal exudate.  Cardiovascular: Normal rate, regular rhythm and normal heart sounds. Exam reveals no gallop and no friction rub.  No murmur heard.  Pulmonary/Chest: Effort normal and breath sounds normal. No respiratory distress. He has no wheezes.  Abdominal: Soft. Bowel sounds are normal. He exhibits no distension. There is no tenderness.  Lymphadenopathy:  He has no cervical adenopathy.  Neurological: He is alert and oriented to person, place, and time.  Skin: Skin is warm and dry. No rash noted. No erythema.  Psychiatric: He has  a normal mood and affect. His behavior is normal.    Lab Results  Component Value Date   CD4TCELL 41 02/16/2015   Lab Results  Component Value Date   CD4TABS 780 02/16/2015   CD4TABS 780 12/22/2014   CD4TABS 920 09/02/2014   Lab Results  Component Value Date   HIV1RNAQUANT <20 02/16/2015   Lab Results  Component Value Date   HEPBSAB NEG 09/02/2014   No results found for: RPR  CBC Lab Results  Component Value Date   WBC 4.7 02/16/2015   RBC 5.33 02/16/2015   HGB 15.6 02/16/2015   HCT 45.6 02/16/2015   PLT 186 02/16/2015   MCV 85.6 02/16/2015   MCH 29.3 02/16/2015   MCHC 34.2 02/16/2015   RDW 15.4 02/16/2015   LYMPHSABS 1.8 02/16/2015   MONOABS 0.5 02/16/2015   EOSABS 0.0 02/16/2015   BASOSABS 0.0 02/16/2015   BMET Lab Results  Component Value Date   NA 140 02/16/2015   K 4.7 02/16/2015   CL 104 02/16/2015   CO2 22 02/16/2015   GLUCOSE 125* 02/16/2015   BUN 13 02/16/2015   CREATININE 0.94 02/16/2015   CALCIUM 9.3 02/16/2015   GFRNONAA >89 12/22/2014   GFRAA >89 12/22/2014     Assessment and Plan  hiv disease = continue with stribild, have him come back in 3-6 months for next follow up visit  Health maintenance =flu vax in the Fall, hep B #3 in mid April  Pre-hypertension = will continue to monitor BP

## 2015-03-07 ENCOUNTER — Other Ambulatory Visit: Payer: Self-pay | Admitting: Licensed Clinical Social Worker

## 2015-03-07 DIAGNOSIS — Z21 Asymptomatic human immunodeficiency virus [HIV] infection status: Secondary | ICD-10-CM

## 2015-03-07 MED ORDER — ELVITEG-COBIC-EMTRICIT-TENOFDF 150-150-200-300 MG PO TABS: 1.0000 | ORAL_TABLET | Freq: Every day | ORAL | Status: DC

## 2015-05-23 ENCOUNTER — Other Ambulatory Visit: Payer: Self-pay

## 2015-05-23 DIAGNOSIS — B20 Human immunodeficiency virus [HIV] disease: Secondary | ICD-10-CM

## 2015-05-23 LAB — COMPLETE METABOLIC PANEL WITH GFR
ALT: 18 U/L (ref 0–53)
AST: 18 U/L (ref 0–37)
Albumin: 4 g/dL (ref 3.5–5.2)
Alkaline Phosphatase: 60 U/L (ref 39–117)
BUN: 14 mg/dL (ref 6–23)
CHLORIDE: 107 meq/L (ref 96–112)
CO2: 26 mEq/L (ref 19–32)
CREATININE: 0.97 mg/dL (ref 0.50–1.35)
Calcium: 9.2 mg/dL (ref 8.4–10.5)
GFR, Est African American: >89 mL/min
Glucose, Bld: 69 mg/dL — ABNORMAL LOW (ref 70–99)
Potassium: 4.3 mEq/L (ref 3.5–5.3)
Sodium: 140 mEq/L (ref 135–145)
Total Bilirubin: 0.3 mg/dL (ref 0.2–1.2)
Total Protein: 6.7 g/dL (ref 6.0–8.3)

## 2015-05-23 LAB — CBC WITH DIFFERENTIAL/PLATELET
BASOS ABS: 0 10*3/uL (ref 0.0–0.1)
BASOS PCT: 0 % (ref 0–1)
Eosinophils Absolute: 0 10*3/uL (ref 0.0–0.7)
Eosinophils Relative: 1 % (ref 0–5)
HEMATOCRIT: 44.7 % (ref 39.0–52.0)
Hemoglobin: 15.1 g/dL (ref 13.0–17.0)
LYMPHS PCT: 54 % — AB (ref 12–46)
Lymphs Abs: 2.2 10*3/uL (ref 0.7–4.0)
MCH: 30 pg (ref 26.0–34.0)
MCHC: 33.8 g/dL (ref 30.0–36.0)
MCV: 88.7 fL (ref 78.0–100.0)
MPV: 10.6 fL (ref 8.6–12.4)
Monocytes Absolute: 0.4 10*3/uL (ref 0.1–1.0)
Monocytes Relative: 11 % (ref 3–12)
NEUTROS ABS: 1.4 10*3/uL — AB (ref 1.7–7.7)
NEUTROS PCT: 34 % — AB (ref 43–77)
PLATELETS: 168 10*3/uL (ref 150–400)
RBC: 5.04 MIL/uL (ref 4.22–5.81)
RDW: 14.4 % (ref 11.5–15.5)
WBC: 4 10*3/uL (ref 4.0–10.5)

## 2015-05-24 LAB — T-HELPER CELL (CD4) - (RCID CLINIC ONLY)
CD4 % Helper T Cell: 39 % (ref 33–55)
CD4 T Cell Abs: 900 /uL (ref 400–2700)

## 2015-05-25 LAB — HIV-1 RNA QUANT-NO REFLEX-BLD
HIV 1 RNA Quant: <20 copies/mL (ref <20)
HIV-1 RNA Quant, Log: <1.3 {Log} (ref <1.30)

## 2015-06-06 ENCOUNTER — Ambulatory Visit (INDEPENDENT_AMBULATORY_CARE_PROVIDER_SITE_OTHER): Payer: Self-pay | Admitting: Internal Medicine

## 2015-06-06 ENCOUNTER — Encounter: Payer: Self-pay | Admitting: Internal Medicine

## 2015-06-06 VITALS — BP 143/87 | HR 81 | Temp 98.0°F | Ht 72.0 in | Wt 230.0 lb

## 2015-06-06 DIAGNOSIS — R635 Abnormal weight gain: Secondary | ICD-10-CM

## 2015-06-06 DIAGNOSIS — B20 Human immunodeficiency virus [HIV] disease: Secondary | ICD-10-CM

## 2015-06-06 DIAGNOSIS — Z720 Tobacco use: Secondary | ICD-10-CM

## 2015-06-06 DIAGNOSIS — Z23 Encounter for immunization: Secondary | ICD-10-CM

## 2015-06-06 DIAGNOSIS — Z716 Tobacco abuse counseling: Secondary | ICD-10-CM

## 2015-06-06 DIAGNOSIS — Z0000 Encounter for general adult medical examination without abnormal findings: Secondary | ICD-10-CM
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
## 2015-06-07 NOTE — Addendum Note (Signed)
Addended by: Andree CossHOWELL, MICHELLE M on: 06/07/2015 08:54 AM   Modules accepted: Orders

## 2015-07-18 ENCOUNTER — Ambulatory Visit: Payer: Self-pay

## 2015-08-10 ENCOUNTER — Other Ambulatory Visit: Payer: Self-pay | Admitting: Internal Medicine

## 2015-08-10 DIAGNOSIS — B20 Human immunodeficiency virus [HIV] disease: Secondary | ICD-10-CM

## 2015-08-25 ENCOUNTER — Other Ambulatory Visit (INDEPENDENT_AMBULATORY_CARE_PROVIDER_SITE_OTHER): Payer: Self-pay

## 2015-08-25 DIAGNOSIS — Z113 Encounter for screening for infections with a predominantly sexual mode of transmission: Secondary | ICD-10-CM

## 2015-08-25 DIAGNOSIS — Z79899 Other long term (current) drug therapy: Secondary | ICD-10-CM

## 2015-08-25 DIAGNOSIS — B20 Human immunodeficiency virus [HIV] disease: Secondary | ICD-10-CM

## 2015-08-25 LAB — COMPLETE METABOLIC PANEL WITH GFR
ALBUMIN: 4.1 g/dL (ref 3.6–5.1)
ALK PHOS: 65 U/L (ref 40–115)
ALT: 17 U/L (ref 9–46)
AST: 17 U/L (ref 10–40)
BUN: 12 mg/dL (ref 7–25)
CO2: 28 mmol/L (ref 20–31)
Calcium: 9.6 mg/dL (ref 8.6–10.3)
Chloride: 105 mmol/L (ref 98–110)
Creat: 1.04 mg/dL (ref 0.60–1.35)
GFR, Est African American: >89 mL/min (ref >=60)
GLUCOSE: 95 mg/dL (ref 65–99)
POTASSIUM: 4.4 mmol/L (ref 3.5–5.3)
SODIUM: 142 mmol/L (ref 135–146)
Total Bilirubin: 0.5 mg/dL (ref 0.2–1.2)
Total Protein: 6.8 g/dL (ref 6.1–8.1)

## 2015-08-25 LAB — LIPID PANEL
CHOL/HDL RATIO: 6.2 ratio — AB (ref <=5.0)
Cholesterol: 222 mg/dL — ABNORMAL HIGH (ref 125–200)
HDL: 36 mg/dL — AB (ref >=40)
LDL CALC: 162 mg/dL — AB (ref <130)
Triglycerides: 122 mg/dL (ref <150)
VLDL: 24 mg/dL (ref <30)

## 2015-08-25 LAB — CBC WITH DIFFERENTIAL/PLATELET
BASOS PCT: 0 % (ref 0–1)
Basophils Absolute: 0 10*3/uL (ref 0.0–0.1)
EOS ABS: 0.1 10*3/uL (ref 0.0–0.7)
Eosinophils Relative: 1 % (ref 0–5)
HCT: 44.2 % (ref 39.0–52.0)
HEMOGLOBIN: 15.5 g/dL (ref 13.0–17.0)
Lymphocytes Relative: 33 % (ref 12–46)
Lymphs Abs: 1.7 10*3/uL (ref 0.7–4.0)
MCH: 30.2 pg (ref 26.0–34.0)
MCHC: 35.1 g/dL (ref 30.0–36.0)
MCV: 86 fL (ref 78.0–100.0)
MPV: 10.8 fL (ref 8.6–12.4)
Monocytes Absolute: 0.6 10*3/uL (ref 0.1–1.0)
Monocytes Relative: 11 % (ref 3–12)
NEUTROS ABS: 2.9 10*3/uL (ref 1.7–7.7)
NEUTROS PCT: 55 % (ref 43–77)
PLATELETS: 183 10*3/uL (ref 150–400)
RBC: 5.14 MIL/uL (ref 4.22–5.81)
RDW: 14.7 % (ref 11.5–15.5)
WBC: 5.2 10*3/uL (ref 4.0–10.5)

## 2015-08-26 LAB — URINE CYTOLOGY ANCILLARY ONLY
Chlamydia: NEGATIVE
Neisseria Gonorrhea: NEGATIVE

## 2015-08-26 LAB — RPR

## 2015-08-29 LAB — T-HELPER CELL (CD4) - (RCID CLINIC ONLY)
CD4 T CELL ABS: 860 /uL (ref 400–2700)
CD4 T CELL HELPER: 46 % (ref 33–55)

## 2015-08-29 LAB — HIV-1 RNA QUANT-NO REFLEX-BLD
HIV 1 RNA Quant: <20 copies/mL (ref <20)
HIV-1 RNA Quant, Log: <1.3 {Log} (ref <1.30)

## 2015-08-29 NOTE — Progress Notes (Signed)
Faxed approval notice to [facility]. Sydna Brodowski M, RN 

## 2015-09-08 ENCOUNTER — Ambulatory Visit (INDEPENDENT_AMBULATORY_CARE_PROVIDER_SITE_OTHER): Payer: Self-pay | Admitting: Internal Medicine

## 2015-09-08 ENCOUNTER — Encounter: Payer: Self-pay | Admitting: Internal Medicine

## 2015-09-08 VITALS — BP 131/86 | HR 59 | Temp 97.6°F | Wt 231.0 lb

## 2015-09-08 DIAGNOSIS — Z21 Asymptomatic human immunodeficiency virus [HIV] infection status: Secondary | ICD-10-CM

## 2015-09-08 DIAGNOSIS — Z23 Encounter for immunization: Secondary | ICD-10-CM

## 2015-09-08 NOTE — Progress Notes (Signed)
Patient ID: John Dixon, male   DOB: 1976-05-06, 39 y.o.   MRN: [id number]       Patient ID: John Dixon, male   DOB: 09/10/1976, 39 y.o.   MRN: [id number]  HPI 39yo M with HIV disease, CD 4 count of 860/LV<20 (sep 2016) on stribild, doing well with good adhnerece. He reports doing quite well. No recent illnesses  Outpatient Encounter Prescriptions as of 09/08/2015  Medication Sig  . STRIBILD 150-150-200-300 MG TABS tablet TAKE 1 TABLET BY MOUTH DAILY WITH BREAKFAST   No facility-administered encounter medications on file as of 09/08/2015.     Patient Active Problem List   Diagnosis Date Noted  . Asymptomatic HIV infection 09/22/2014     Health Maintenance Due  Topic Date Due  . INFLUENZA VACCINE  07/11/2015     Review of Systems Review of Systems  Constitutional: Negative for fever, chills, diaphoresis, activity change, appetite change, fatigue and unexpected weight change.  HENT: Negative for congestion, sore throat, rhinorrhea, sneezing, trouble swallowing and sinus pressure.  Eyes: Negative for photophobia and visual disturbance.  Respiratory: Negative for cough, chest tightness, shortness of breath, wheezing and stridor.  Cardiovascular: Negative for chest pain, palpitations and leg swelling.  Gastrointestinal: Negative for nausea, vomiting, abdominal pain, diarrhea, constipation, blood in stool, abdominal distention and anal bleeding.  Genitourinary: Negative for dysuria, hematuria, flank pain and difficulty urinating.  Musculoskeletal: Negative for myalgias, back pain, joint swelling, arthralgias and gait problem.  Skin: Negative for color change, pallor, rash and wound.  Neurological: Negative for dizziness, tremors, weakness and light-headedness.  Hematological: Negative for adenopathy. Does not bruise/bleed easily.  Psychiatric/Behavioral: Negative for behavioral problems, confusion, sleep disturbance, dysphoric mood, decreased concentration and agitation.     Physical Exam   BP 131/86 mmHg  Pulse 59  Temp(Src) 97.6 F (36.4 C) (Oral)  Wt 231 lb (104.781 kg) Physical Exam  Constitutional: He is oriented to person, place, and time. He appears well-developed and well-nourished. No distress.  HENT:  Mouth/Throat: Oropharynx is clear and moist. No oropharyngeal exudate.  Cardiovascular: Normal rate, regular rhythm and normal heart sounds. Exam reveals no gallop and no friction rub.  No murmur heard.  Pulmonary/Chest: Effort normal and breath sounds normal. No respiratory distress. He has no wheezes.  Abdominal: Soft. Bowel sounds are normal. He exhibits no distension. There is no tenderness.  Lymphadenopathy:  He has no cervical adenopathy.  Neurological: He is alert and oriented to person, place, and time.  Skin: Skin is warm and dry. No rash noted. No erythema.  Psychiatric: He has a normal mood and affect. His behavior is normal.    Lab Results  Component Value Date   CD4TCELL 46 08/25/2015   Lab Results  Component Value Date   CD4TABS 860 08/25/2015   CD4TABS 900 05/23/2015   CD4TABS 780 02/16/2015   Lab Results  Component Value Date   HIV1RNAQUANT <20 08/25/2015   Lab Results  Component Value Date   HEPBSAB NEG 09/02/2014   No results found for: RPR  CBC Lab Results  Component Value Date   WBC 5.2 08/25/2015   RBC 5.14 08/25/2015   HGB 15.5 08/25/2015   HCT 44.2 08/25/2015   PLT 183 08/25/2015   MCV 86.0 08/25/2015   MCH 30.2 08/25/2015   MCHC 35.1 08/25/2015   RDW 14.7 08/25/2015   LYMPHSABS 1.7 08/25/2015   MONOABS 0.6 08/25/2015   EOSABS 0.1 08/25/2015   BASOSABS 0.0 08/25/2015   BMET Lab Results  Component Value Date  NA 142 08/25/2015   K 4.4 08/25/2015   CL 105 08/25/2015   CO2 28 08/25/2015   GLUCOSE 95 08/25/2015   BUN 12 08/25/2015   CREATININE 1.04 08/25/2015   CALCIUM 9.6 08/25/2015   GFRNONAA >89 08/25/2015   GFRAA >89 08/25/2015     Assessment and Plan  hiv disease = continue  on stribild. Well controlled. Excellent adherence  Health maintenance = flu shot  hiv prevention = will give condoms  Arrange appt for wife

## 2015-11-29 ENCOUNTER — Other Ambulatory Visit (INDEPENDENT_AMBULATORY_CARE_PROVIDER_SITE_OTHER): Payer: Self-pay

## 2015-11-29 DIAGNOSIS — B20 Human immunodeficiency virus [HIV] disease: Secondary | ICD-10-CM

## 2015-11-29 LAB — CBC WITH DIFFERENTIAL/PLATELET
BASOS PCT: 0 % (ref 0–1)
Basophils Absolute: 0 10*3/uL (ref 0.0–0.1)
Eosinophils Absolute: 0.1 10*3/uL (ref 0.0–0.7)
Eosinophils Relative: 1 % (ref 0–5)
HCT: 45.4 % (ref 39.0–52.0)
HEMOGLOBIN: 15.4 g/dL (ref 13.0–17.0)
Lymphocytes Relative: 35 % (ref 12–46)
Lymphs Abs: 2.5 10*3/uL (ref 0.7–4.0)
MCH: 29.5 pg (ref 26.0–34.0)
MCHC: 33.9 g/dL (ref 30.0–36.0)
MCV: 87 fL (ref 78.0–100.0)
MONOS PCT: 8 % (ref 3–12)
MPV: 10.7 fL (ref 8.6–12.4)
Monocytes Absolute: 0.6 10*3/uL (ref 0.1–1.0)
NEUTROS ABS: 4 10*3/uL (ref 1.7–7.7)
NEUTROS PCT: 56 % (ref 43–77)
PLATELETS: 196 10*3/uL (ref 150–400)
RBC: 5.22 MIL/uL (ref 4.22–5.81)
RDW: 14.2 % (ref 11.5–15.5)
WBC: 7.1 10*3/uL (ref 4.0–10.5)

## 2015-11-29 LAB — COMPLETE METABOLIC PANEL WITH GFR
ALBUMIN: 4.1 g/dL (ref 3.6–5.1)
ALK PHOS: 60 U/L (ref 40–115)
ALT: 16 U/L (ref 9–46)
AST: 15 U/L (ref 10–40)
BUN: 17 mg/dL (ref 7–25)
CO2: 23 mmol/L (ref 20–31)
Calcium: 9 mg/dL (ref 8.6–10.3)
Chloride: 105 mmol/L (ref 98–110)
Creat: 0.96 mg/dL (ref 0.60–1.35)
GFR, Est African American: >89 mL/min (ref >=60)
GLUCOSE: 89 mg/dL (ref 65–99)
POTASSIUM: 4.4 mmol/L (ref 3.5–5.3)
SODIUM: 139 mmol/L (ref 135–146)
Total Bilirubin: 0.4 mg/dL (ref 0.2–1.2)
Total Protein: 6.7 g/dL (ref 6.1–8.1)

## 2015-12-01 LAB — T-HELPER CELL (CD4) - (RCID CLINIC ONLY)
CD4 T CELL ABS: 990 /uL (ref 400–2700)
CD4 T CELL HELPER: 40 % (ref 33–55)

## 2015-12-02 LAB — HIV-1 RNA QUANT-NO REFLEX-BLD

## 2015-12-13 ENCOUNTER — Ambulatory Visit (INDEPENDENT_AMBULATORY_CARE_PROVIDER_SITE_OTHER): Payer: Self-pay | Admitting: Internal Medicine

## 2015-12-13 ENCOUNTER — Encounter: Payer: Self-pay | Admitting: Internal Medicine

## 2015-12-13 ENCOUNTER — Ambulatory Visit: Payer: Self-pay

## 2015-12-13 VITALS — BP 132/79 | HR 68 | Temp 97.5°F | Wt 227.0 lb

## 2015-12-13 DIAGNOSIS — B20 Human immunodeficiency virus [HIV] disease: Secondary | ICD-10-CM

## 2015-12-13 NOTE — Progress Notes (Signed)
Patient ID: John Dixon, male   DOB: May 06, 1976, 40 y.o.   MRN: [id number]      Rfv: follow up on hiv disease  Patient ID: John Dixon, male   DOB: April 04, 1976, 40 y.o.   MRN: [id number]  HPI 40yo M with hiv disease, Cd 4 count of 990/VL<20 doing well on stribild. Doing well with adherence. No problems with health.   Outpatient Encounter Prescriptions as of 12/13/2015  Medication Sig  . STRIBILD 150-150-200-300 MG TABS tablet TAKE 1 TABLET BY MOUTH DAILY WITH BREAKFAST   No facility-administered encounter medications on file as of 12/13/2015.     Patient Active Problem List   Diagnosis Date Noted  . Asymptomatic HIV infection (HCC) 09/22/2014   Social History  Substance Use Topics  . Smoking status: Current Every Day Smoker -- 0.50 packs/day for 4 years    Types: Cigarettes  . Smokeless tobacco: Never Used     Comment: cutting back  . Alcohol Use: No   family history is not on file.  There are no preventive care reminders to display for this patient.   Review of Systems  Constitutional: Negative for fever, chills, diaphoresis, activity change, appetite change, fatigue and unexpected weight change.  HENT: Negative for congestion, sore throat, rhinorrhea, sneezing, trouble swallowing and sinus pressure.  Eyes: Negative for photophobia and visual disturbance.  Respiratory: Negative for cough, chest tightness, shortness of breath, wheezing and stridor.  Cardiovascular: Negative for chest pain, palpitations and leg swelling.  Gastrointestinal: Negative for nausea, vomiting, abdominal pain, diarrhea, constipation, blood in stool, abdominal distention and anal bleeding.  Genitourinary: Negative for dysuria, hematuria, flank pain and difficulty urinating.  Musculoskeletal: Negative for myalgias, back pain, joint swelling, arthralgias and gait problem.  Skin: Negative for color change, pallor, rash and wound.  Neurological: Negative for dizziness, tremors, weakness and  light-headedness.  Hematological: Negative for adenopathy. Does not bruise/bleed easily.  Psychiatric/Behavioral: Negative for behavioral problems, confusion, sleep disturbance, dysphoric mood, decreased concentration and agitation.   Physical Exam   BP 132/79 mmHg  Pulse 68  Temp(Src) 97.5 F (36.4 C) (Oral)  Wt 227 lb (102.967 kg) Physical Exam  Constitutional: He is oriented to person, place, and time. He appears well-developed and well-nourished. No distress.  HENT:  Mouth/Throat: Oropharynx is clear and moist. No oropharyngeal exudate.  Cardiovascular: Normal rate, regular rhythm and normal heart sounds. Exam reveals no gallop and no friction rub.  No murmur heard.  Pulmonary/Chest: Effort normal and breath sounds normal. No respiratory distress. He has no wheezes.  Abdominal: Soft. Bowel sounds are normal. He exhibits no distension. There is no tenderness.  Lymphadenopathy:  He has no cervical adenopathy.  Neurological: He is alert and oriented to person, place, and time.  Skin: Skin is warm and dry. No rash noted. No erythema.  Psychiatric: He has a normal mood and affect. His behavior is normal.    Lab Results  Component Value Date   CD4TCELL 40 11/29/2015   Lab Results  Component Value Date   CD4TABS 990 11/29/2015   CD4TABS 860 08/25/2015   CD4TABS 900 05/23/2015   Lab Results  Component Value Date   HIV1RNAQUANT <20 11/29/2015   Lab Results  Component Value Date   HEPBSAB NEG 09/02/2014   No results found for: RPR  CBC Lab Results  Component Value Date   WBC 7.1 11/29/2015   RBC 5.22 11/29/2015   HGB 15.4 11/29/2015   HCT 45.4 11/29/2015   PLT 196 11/29/2015   MCV 87.0  11/29/2015   MCH 29.5 11/29/2015   MCHC 33.9 11/29/2015   RDW 14.2 11/29/2015   LYMPHSABS 2.5 11/29/2015   MONOABS 0.6 11/29/2015   EOSABS 0.1 11/29/2015   BASOSABS 0.0 11/29/2015   BMET Lab Results  Component Value Date   NA 139 11/29/2015   K 4.4 11/29/2015   CL 105  11/29/2015   CO2 23 11/29/2015   GLUCOSE 89 11/29/2015   BUN 17 11/29/2015   CREATININE 0.96 11/29/2015   CALCIUM 9.0 11/29/2015   GFRNONAA >89 11/29/2015   GFRAA >89 11/29/2015     Assessment and Plan  asx hiv disease = continue with stribild. Will need to redo adap paper work for acces to meds  Drug monitoring = cr stable  Health maintenance = already vaccinated for flu. Will check sti at next visit  rtc in 6 months

## 2016-03-06 ENCOUNTER — Other Ambulatory Visit: Payer: Self-pay | Admitting: Internal Medicine

## 2016-03-06 DIAGNOSIS — B20 Human immunodeficiency virus [HIV] disease: Secondary | ICD-10-CM

## 2016-05-24 ENCOUNTER — Other Ambulatory Visit (INDEPENDENT_AMBULATORY_CARE_PROVIDER_SITE_OTHER): Payer: Self-pay

## 2016-05-24 DIAGNOSIS — B20 Human immunodeficiency virus [HIV] disease: Secondary | ICD-10-CM

## 2016-05-24 LAB — CBC WITH DIFFERENTIAL/PLATELET
BASOS PCT: 0 %
Basophils Absolute: 0 cells/uL (ref 0–200)
Eosinophils Absolute: 86 cells/uL (ref 15–500)
Eosinophils Relative: 2 %
HCT: 45.2 % (ref 38.5–50.0)
Hemoglobin: 15.5 g/dL (ref 13.2–17.1)
LYMPHS PCT: 54 %
Lymphs Abs: 2322 cells/uL (ref 850–3900)
MCH: 30.3 pg (ref 27.0–33.0)
MCHC: 34.3 g/dL (ref 32.0–36.0)
MCV: 88.3 fL (ref 80.0–100.0)
MONOS PCT: 10 %
MPV: 9.8 fL (ref 7.5–12.5)
Monocytes Absolute: 430 cells/uL (ref 200–950)
Neutro Abs: 1462 cells/uL — ABNORMAL LOW (ref 1500–7800)
Neutrophils Relative %: 34 %
PLATELETS: 166 10*3/uL (ref 140–400)
RBC: 5.12 MIL/uL (ref 4.20–5.80)
RDW: 14.3 % (ref 11.0–15.0)
WBC: 4.3 10*3/uL (ref 3.8–10.8)

## 2016-05-25 LAB — BASIC METABOLIC PANEL
BUN: 16 mg/dL (ref 7–25)
CO2: 24 mmol/L (ref 20–31)
CREATININE: 0.97 mg/dL (ref 0.60–1.35)
Calcium: 9.1 mg/dL (ref 8.6–10.3)
Chloride: 107 mmol/L (ref 98–110)
Glucose, Bld: 96 mg/dL (ref 65–99)
Potassium: 4.2 mmol/L (ref 3.5–5.3)
Sodium: 140 mmol/L (ref 135–146)

## 2016-05-25 LAB — HIV-1 RNA QUANT-NO REFLEX-BLD

## 2016-05-25 LAB — T-HELPER CELL (CD4) - (RCID CLINIC ONLY)
CD4 % Helper T Cell: 40 % (ref 33–55)
CD4 T Cell Abs: 1010 /uL (ref 400–2700)

## 2016-06-07 ENCOUNTER — Ambulatory Visit: Payer: Self-pay | Admitting: Internal Medicine

## 2016-07-12 ENCOUNTER — Ambulatory Visit (INDEPENDENT_AMBULATORY_CARE_PROVIDER_SITE_OTHER): Payer: Self-pay | Admitting: Internal Medicine

## 2016-07-12 ENCOUNTER — Encounter: Payer: Self-pay | Admitting: Internal Medicine

## 2016-07-12 VITALS — BP 143/92 | HR 74 | Temp 98.0°F | Ht 72.0 in | Wt 229.0 lb

## 2016-07-12 DIAGNOSIS — B20 Human immunodeficiency virus [HIV] disease: Secondary | ICD-10-CM

## 2016-07-12 DIAGNOSIS — Z720 Tobacco use: Secondary | ICD-10-CM

## 2016-07-12 DIAGNOSIS — Z716 Tobacco abuse counseling: Secondary | ICD-10-CM

## 2016-07-12 DIAGNOSIS — R030 Elevated blood-pressure reading, without diagnosis of hypertension: Secondary | ICD-10-CM

## 2016-07-12 MED ORDER — ELVITEG-COBIC-EMTRICIT-TENOFAF 150-150-200-10 MG PO TABS: 1.0000 | ORAL_TABLET | Freq: Every day | ORAL | 11 refills | Status: DC

## 2016-07-12 NOTE — Progress Notes (Signed)
Patient ID: John Dixon, male   DOB: 1976/10/12, 40 y.o.   MRN: [id number]  HPI 40yo M with HIV disease, CD 4 count of 1010/VL<20 on stribild, has good adherence. He states that he is in good health. He has not missed a dose of taking his medication. No complaints  Soc hx: no new partners.  Smoking - tobacco 2 pack per week. Marijuana use+  Outpatient Encounter Prescriptions as of 07/12/2016  Medication Sig  . STRIBILD 150-150-200-300 MG TABS tablet TAKE 1 TABLET BY MOUTH DAILY WITH BREAKFAST   No facility-administered encounter medications on file as of 07/12/2016.      Patient Active Problem List   Diagnosis Date Noted  . Asymptomatic HIV infection (HCC) 09/22/2014     Health Maintenance Due  Topic Date Due  . INFLUENZA VACCINE  07/10/2016     Review of Systems   Constitutional: Negative for fever, chills, diaphoresis, activity change, appetite change, fatigue and unexpected weight change.  HENT: Negative for congestion, sore throat, rhinorrhea, sneezing, trouble swallowing and sinus pressure.  Eyes: Negative for photophobia and visual disturbance.  Respiratory: Negative for cough, chest tightness, shortness of breath, wheezing and stridor.  Cardiovascular: Negative for chest pain, palpitations and leg swelling.  Gastrointestinal: Negative for nausea, vomiting, abdominal pain, diarrhea, constipation, blood in stool, abdominal distention and anal bleeding.  Genitourinary: Negative for dysuria, hematuria, flank pain and difficulty urinating.  Musculoskeletal: Negative for myalgias, back pain, joint swelling, arthralgias and gait problem.  Skin: Negative for color change, pallor, rash and wound.  Neurological: Negative for dizziness, tremors, weakness and light-headedness.  Hematological: Negative for adenopathy. Does not bruise/bleed easily.  Psychiatric/Behavioral: Negative for behavioral problems, confusion, sleep disturbance, dysphoric mood, decreased concentration  and agitation.    Physical Exam   BP (!) 143/92   Pulse 74   Temp 98 F (36.7 C) (Oral)   Ht 6' (1.829 m)   Wt 229 lb (103.9 kg)   SpO2 100%   BMI 31.06 kg/m   Constitutional: He is oriented to person, place, and time. He appears well-developed and well-nourished. No distress.  HENT:  Mouth/Throat: Oropharynx is clear and moist. No oropharyngeal exudate.  Cardiovascular: Normal rate, regular rhythm and normal heart sounds. Exam reveals no gallop and no friction rub.  No murmur heard.  Pulmonary/Chest: Effort normal and breath sounds normal. No respiratory distress. He has no wheezes.  Abdominal: Soft. Bowel sounds are normal. He exhibits no distension. There is no tenderness.  Lymphadenopathy:  He has no cervical adenopathy.  Neurological: He is alert and oriented to person, place, and time.  Skin: Skin is warm and dry. No rash noted. No erythema.  Psychiatric: He has a normal mood and affect. His behavior is normal.    Lab Results  Component Value Date   CD4TCELL 40 05/24/2016   Lab Results  Component Value Date   CD4TABS 1,010 05/24/2016   CD4TABS 990 11/29/2015   CD4TABS 860 08/25/2015   Lab Results  Component Value Date   HIV1RNAQUANT <20 05/24/2016   Lab Results  Component Value Date   HEPBSAB NEG 09/02/2014   No results found for: RPR  CBC Lab Results  Component Value Date   WBC 4.3 05/24/2016   RBC 5.12 05/24/2016   HGB 15.5 05/24/2016   HCT 45.2 05/24/2016   PLT 166 05/24/2016   MCV 88.3 05/24/2016   MCH 30.3 05/24/2016   MCHC 34.3 05/24/2016   RDW 14.3 05/24/2016   LYMPHSABS 2,322 05/24/2016  MONOABS 430 05/24/2016   EOSABS 86 05/24/2016   BASOSABS 0 05/24/2016   BMET Lab Results  Component Value Date   NA 140 05/24/2016   K 4.2 05/24/2016   CL 107 05/24/2016   CO2 24 05/24/2016   GLUCOSE 96 05/24/2016   BUN 16 05/24/2016   CREATININE 0.97 05/24/2016   CALCIUM 9.1 05/24/2016   GFRNONAA >89 11/29/2015   GFRAA >89 11/29/2015      Assessment and Plan  hiv disease = change to genvoya. Will see back in 3 months to check labs and flu vac  Smoking cessation = currently a pack lasts 4 days. Will attempt to decrease smoking. Appears to be precontemplative. Spent 5 min in direct one on one counseling  Marijuana use = again encouraged to decrease intake. If he feels he is using it for anxiety, we discussed maybe using ssri. Will discuss again at our next meeting  Pre hypertensive = discussed decreased smoking, decrease salt intake to see if that impacts his HTN at next visit  Health maintenance = flu vaccination at next appt, will check post vac hep b s ab

## 2016-08-10 ENCOUNTER — Other Ambulatory Visit: Payer: Self-pay | Admitting: Internal Medicine

## 2016-08-10 DIAGNOSIS — B20 Human immunodeficiency virus [HIV] disease: Secondary | ICD-10-CM

## 2016-10-04 ENCOUNTER — Other Ambulatory Visit: Payer: Self-pay | Admitting: *Deleted

## 2016-10-04 ENCOUNTER — Ambulatory Visit: Payer: Self-pay

## 2016-10-04 DIAGNOSIS — B20 Human immunodeficiency virus [HIV] disease: Secondary | ICD-10-CM

## 2016-10-04 MED ORDER — ELVITEG-COBIC-EMTRICIT-TENOFAF 150-150-200-10 MG PO TABS: 1.0000 | ORAL_TABLET | Freq: Every day | ORAL | 3 refills | Status: DC

## 2016-10-09 ENCOUNTER — Encounter: Payer: Self-pay | Admitting: Internal Medicine

## 2016-10-30 ENCOUNTER — Other Ambulatory Visit: Payer: Self-pay

## 2016-11-05 ENCOUNTER — Other Ambulatory Visit (INDEPENDENT_AMBULATORY_CARE_PROVIDER_SITE_OTHER): Payer: Self-pay

## 2016-11-05 DIAGNOSIS — Z113 Encounter for screening for infections with a predominantly sexual mode of transmission: Secondary | ICD-10-CM

## 2016-11-05 DIAGNOSIS — B20 Human immunodeficiency virus [HIV] disease: Secondary | ICD-10-CM

## 2016-11-05 DIAGNOSIS — Z21 Asymptomatic human immunodeficiency virus [HIV] infection status: Secondary | ICD-10-CM

## 2016-11-05 LAB — COMPLETE METABOLIC PANEL WITH GFR
ALT: 18 U/L (ref 9–46)
AST: 19 U/L (ref 10–40)
Albumin: 4.1 g/dL (ref 3.6–5.1)
Alkaline Phosphatase: 62 U/L (ref 40–115)
BUN: 14 mg/dL (ref 7–25)
CO2: 27 mmol/L (ref 20–31)
Calcium: 8.8 mg/dL (ref 8.6–10.3)
Chloride: 106 mmol/L (ref 98–110)
Creat: 0.99 mg/dL (ref 0.60–1.35)
GLUCOSE: 106 mg/dL — AB (ref 65–99)
POTASSIUM: 4.3 mmol/L (ref 3.5–5.3)
SODIUM: 139 mmol/L (ref 135–146)
Total Bilirubin: 0.3 mg/dL (ref 0.2–1.2)
Total Protein: 6.7 g/dL (ref 6.1–8.1)

## 2016-11-05 LAB — CBC WITH DIFFERENTIAL/PLATELET
BASOS ABS: 0 {cells}/uL (ref 0–200)
BASOS PCT: 0 %
EOS ABS: 48 {cells}/uL (ref 15–500)
EOS PCT: 1 %
HCT: 46.1 % (ref 38.5–50.0)
HEMOGLOBIN: 15.7 g/dL (ref 13.2–17.1)
LYMPHS ABS: 2304 {cells}/uL (ref 850–3900)
Lymphocytes Relative: 48 %
MCH: 30.1 pg (ref 27.0–33.0)
MCHC: 34.1 g/dL (ref 32.0–36.0)
MCV: 88.3 fL (ref 80.0–100.0)
MONOS PCT: 11 %
MPV: 10.9 fL (ref 7.5–12.5)
Monocytes Absolute: 528 cells/uL (ref 200–950)
NEUTROS ABS: 1920 {cells}/uL (ref 1500–7800)
Neutrophils Relative %: 40 %
PLATELETS: 198 10*3/uL (ref 140–400)
RBC: 5.22 MIL/uL (ref 4.20–5.80)
RDW: 14.5 % (ref 11.0–15.0)
WBC: 4.8 10*3/uL (ref 3.8–10.8)

## 2016-11-06 LAB — RPR

## 2016-11-06 LAB — T-HELPER CELL (CD4) - (RCID CLINIC ONLY)
CD4 % Helper T Cell: 34 % (ref 33–55)
CD4 T Cell Abs: 880 /uL (ref 400–2700)

## 2016-11-07 LAB — HIV-1 RNA QUANT-NO REFLEX-BLD

## 2016-11-07 LAB — URINE CYTOLOGY ANCILLARY ONLY
CHLAMYDIA, DNA PROBE: NEGATIVE
Neisseria Gonorrhea: NEGATIVE

## 2016-11-13 ENCOUNTER — Encounter: Payer: Self-pay | Admitting: Internal Medicine

## 2016-11-13 ENCOUNTER — Ambulatory Visit (INDEPENDENT_AMBULATORY_CARE_PROVIDER_SITE_OTHER): Payer: Self-pay | Admitting: Internal Medicine

## 2016-11-13 DIAGNOSIS — Z23 Encounter for immunization: Secondary | ICD-10-CM

## 2016-11-13 NOTE — Progress Notes (Signed)
Patient ID: John Dixon, male   DOB: Apr 06, 1976, 40 y.o.   MRN: [id number]  HPI c4 count with hiv disease, CD 4 count of 880/VL<20, on genvoya. No recent illnesses. Not missing doses. Genvoya doing good.   Right temple pain once a day. He thinks it may be stress related  Discussed having a child with his girflriend who is uninfected.  Outpatient Encounter Prescriptions as of 11/13/2016  Medication Sig  . elvitegravir-cobicistat-emtricitabine-tenofovir (GENVOYA) 150-150-200-10 MG TABS tablet Take 1 tablet by mouth daily with breakfast.   No facility-administered encounter medications on file as of 11/13/2016.      Patient Active Problem List   Diagnosis Date Noted  . Asymptomatic HIV infection ([place]) 09/22/2014     Health Maintenance Due  Topic Date Due  . INFLUENZA VACCINE  07/10/2016     Review of Systems  Constitutional: Negative for fever, chills, diaphoresis, activity change, appetite change, fatigue and unexpected weight change.  HENT: Negative for congestion, sore throat, rhinorrhea, sneezing, trouble swallowing and sinus pressure.  Eyes: Negative for photophobia and visual disturbance.  Respiratory: Negative for cough, chest tightness, shortness of breath, wheezing and stridor.  Cardiovascular: Negative for chest pain, palpitations and leg swelling.  Gastrointestinal: Negative for nausea, vomiting, abdominal pain, diarrhea, constipation, blood in stool, abdominal distention and anal bleeding.  Genitourinary: Negative for dysuria, hematuria, flank pain and difficulty urinating.  Musculoskeletal: Negative for myalgias, back pain, joint swelling, arthralgias and gait problem.  Skin: Negative for color change, pallor, rash and wound.  Neurological: Negative for dizziness, tremors, weakness and light-headedness.  Hematological: Negative for adenopathy. Does not bruise/bleed easily.  Psychiatric/Behavioral: Negative for behavioral problems, confusion, sleep  disturbance, dysphoric mood, decreased concentration and agitation.    Physical Exam   BP (!) 149/105 (BP Location: Left Arm, Patient Position: Sitting, Cuff Size: Large)   Pulse 62   Temp 98.2 F (36.8 C)   Wt 230 lb 12.8 oz (104.7 kg)   SpO2 100%   BMI 31.30 kg/m  Physical Exam  Constitutional: He is oriented to person, place, and time. He appears well-developed and well-nourished. No distress.  HENT:  Mouth/Throat: Oropharynx is clear and moist. No oropharyngeal exudate.  Cardiovascular: Normal rate, regular rhythm and normal heart sounds. Exam reveals no gallop and no friction rub.  No murmur heard.  Pulmonary/Chest: Effort normal and breath sounds normal. No respiratory distress. He has no wheezes.  Abdominal: Soft. Bowel sounds are normal. He exhibits no distension. There is no tenderness.  Lymphadenopathy:  He has no cervical adenopathy.  Neurological: He is alert and oriented to person, place, and time.  Skin: Skin is warm and dry. No rash noted. No erythema.  Psychiatric: He has a normal mood and affect. His behavior is normal.    Lab Results  Component Value Date   CD4TCELL 34 11/05/2016   Lab Results  Component Value Date   CD4TABS 880 11/05/2016   CD4TABS 1,010 05/24/2016   CD4TABS 990 11/29/2015   Lab Results  Component Value Date   HIV1RNAQUANT <20 11/05/2016   Lab Results  Component Value Date   HEPBSAB NEG 09/02/2014   No results found for: RPR  CBC Lab Results  Component Value Date   WBC 4.8 11/05/2016   RBC 5.22 11/05/2016   HGB 15.7 11/05/2016   HCT 46.1 11/05/2016   PLT 198 11/05/2016   MCV 88.3 11/05/2016   MCH 30.1 11/05/2016   MCHC 34.1 11/05/2016   RDW 14.5 11/05/2016   LYMPHSABS 2,304  11/05/2016   MONOABS 528 11/05/2016   EOSABS 48 11/05/2016   BASOSABS 0 11/05/2016   BMET Lab Results  Component Value Date   NA 139 11/05/2016   K 4.3 11/05/2016   CL 106 11/05/2016   CO2 27 11/05/2016   GLUCOSE 106 (H) 11/05/2016   BUN  14 11/05/2016   CREATININE 0.99 11/05/2016   CALCIUM 8.8 11/05/2016   GFRNONAA >89 11/05/2016   GFRAA >89 11/05/2016     Assessment and Plan  hiv disease = well controlled continue with current regimen  Family planning = discussed as long as he is undetectable that he won't transmit hiv to his girlfriend. Discussed if they want, can do PrEP. Would recommend using ovulation kit to help time unprotected sex  Stress headache = prn tylenol

## 2017-01-06 ENCOUNTER — Emergency Department (HOSPITAL_COMMUNITY)
Admission: EM | Admit: 2017-01-06 | Discharge: 2017-01-06 | Disposition: A | Discharge status: Home / Self Care | Payer: Self-pay | Attending: Emergency Medicine | Admitting: Emergency Medicine

## 2017-01-06 ENCOUNTER — Emergency Department (HOSPITAL_COMMUNITY): Payer: Self-pay

## 2017-01-06 ENCOUNTER — Encounter (HOSPITAL_COMMUNITY): Payer: Self-pay | Admitting: Emergency Medicine

## 2017-01-06 DIAGNOSIS — J111 Influenza due to unidentified influenza virus with other respiratory manifestations: Secondary | ICD-10-CM | POA: Insufficient documentation

## 2017-01-06 DIAGNOSIS — F17210 Nicotine dependence, cigarettes, uncomplicated: Secondary | ICD-10-CM | POA: Insufficient documentation

## 2017-01-06 DIAGNOSIS — R69 Illness, unspecified: Secondary | ICD-10-CM

## 2017-01-06 HISTORY — DX: Human immunodeficiency virus (HIV) disease: B20

## 2017-01-06 MED ORDER — OSELTAMIVIR PHOSPHATE 75 MG PO CAPS: 75.0000 mg | ORAL_CAPSULE | Freq: Two times a day (BID) | ORAL | 0 refills | Status: DC

## 2017-01-06 MED ORDER — ACETAMINOPHEN 500 MG PO TABS
1000.0000 mg | ORAL_TABLET | Freq: Once | ORAL | Status: AC
  Administered 2017-01-06: 1000 mg via ORAL
  Filled 2017-01-06: qty 2

## 2017-01-06 MED ORDER — OSELTAMIVIR PHOSPHATE 75 MG PO CAPS
75.0000 mg | ORAL_CAPSULE | Freq: Once | ORAL | Status: AC
Start: 2017-01-06 — End: 2017-01-06
  Administered 2017-01-06: 75 mg via ORAL
  Filled 2017-01-06: qty 1
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
## 2017-01-06 NOTE — ED Notes (Signed)
Pt given crackers and sprite tolerated well

## 2017-01-06 NOTE — ED Triage Notes (Signed)
Flu s/s 2 days no n/v having sweating and chills low grade fever, pt ambulatory to room

## 2017-01-28 ENCOUNTER — Encounter: Payer: Self-pay | Admitting: Internal Medicine

## 2017-01-28 ENCOUNTER — Ambulatory Visit (INDEPENDENT_AMBULATORY_CARE_PROVIDER_SITE_OTHER): Payer: Self-pay

## 2017-01-28 DIAGNOSIS — Z79899 Other long term (current) drug therapy: Secondary | ICD-10-CM

## 2017-01-28 DIAGNOSIS — B20 Human immunodeficiency virus [HIV] disease: Secondary | ICD-10-CM

## 2017-01-28 LAB — LIPID PANEL
CHOL/HDL RATIO: 6.6 ratio — AB (ref <5.0)
Cholesterol: 232 mg/dL — ABNORMAL HIGH (ref <200)
HDL: 35 mg/dL — AB (ref >40)
LDL CALC: 156 mg/dL — AB (ref <100)
TRIGLYCERIDES: 206 mg/dL — AB (ref <150)
VLDL: 41 mg/dL — ABNORMAL HIGH (ref <30)

## 2017-01-29 LAB — T-HELPER CELL (CD4) - (RCID CLINIC ONLY)
CD4 T CELL ABS: 1020 /uL (ref 400–2700)
CD4 T CELL HELPER: 38 % (ref 33–55)

## 2017-01-31 LAB — HIV-1 RNA QUANT-NO REFLEX-BLD
HIV 1 RNA QUANT: NOT DETECTED {copies}/mL
HIV-1 RNA Quant, Log: <1.3 Log copies/mL

## 2017-02-11 ENCOUNTER — Ambulatory Visit (INDEPENDENT_AMBULATORY_CARE_PROVIDER_SITE_OTHER): Payer: Self-pay | Admitting: Internal Medicine

## 2017-02-11 ENCOUNTER — Encounter: Payer: Self-pay | Admitting: Internal Medicine

## 2017-02-11 VITALS — Ht 72.0 in | Wt 235.0 lb

## 2017-02-11 DIAGNOSIS — Z23 Encounter for immunization: Secondary | ICD-10-CM

## 2017-02-11 MED ORDER — MENINGOCOCCAL A C Y&W-135 OLIG IM SOLR
0.5000 mL | Freq: Once | INTRAMUSCULAR | Status: AC
  Administered 2017-02-11: 0.5 mL via INTRAMUSCULAR

## 2017-02-11 MED ORDER — ROSUVASTATIN CALCIUM 10 MG PO TABS: 10.0000 mg | ORAL_TABLET | Freq: Every day | ORAL | 11 refills | Status: DC
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
## 2017-07-12 ENCOUNTER — Other Ambulatory Visit: Payer: Self-pay | Admitting: Internal Medicine

## 2017-07-12 DIAGNOSIS — B20 Human immunodeficiency virus [HIV] disease: Secondary | ICD-10-CM

## 2017-07-18 IMAGING — DX DG CHEST 2V
2 series · 2 of 2 positions shown · non-contrast
Comparison: None.

CLINICAL DATA: Patient with cough and fever.

EXAM:
CHEST  2 VIEW

[chest pa]
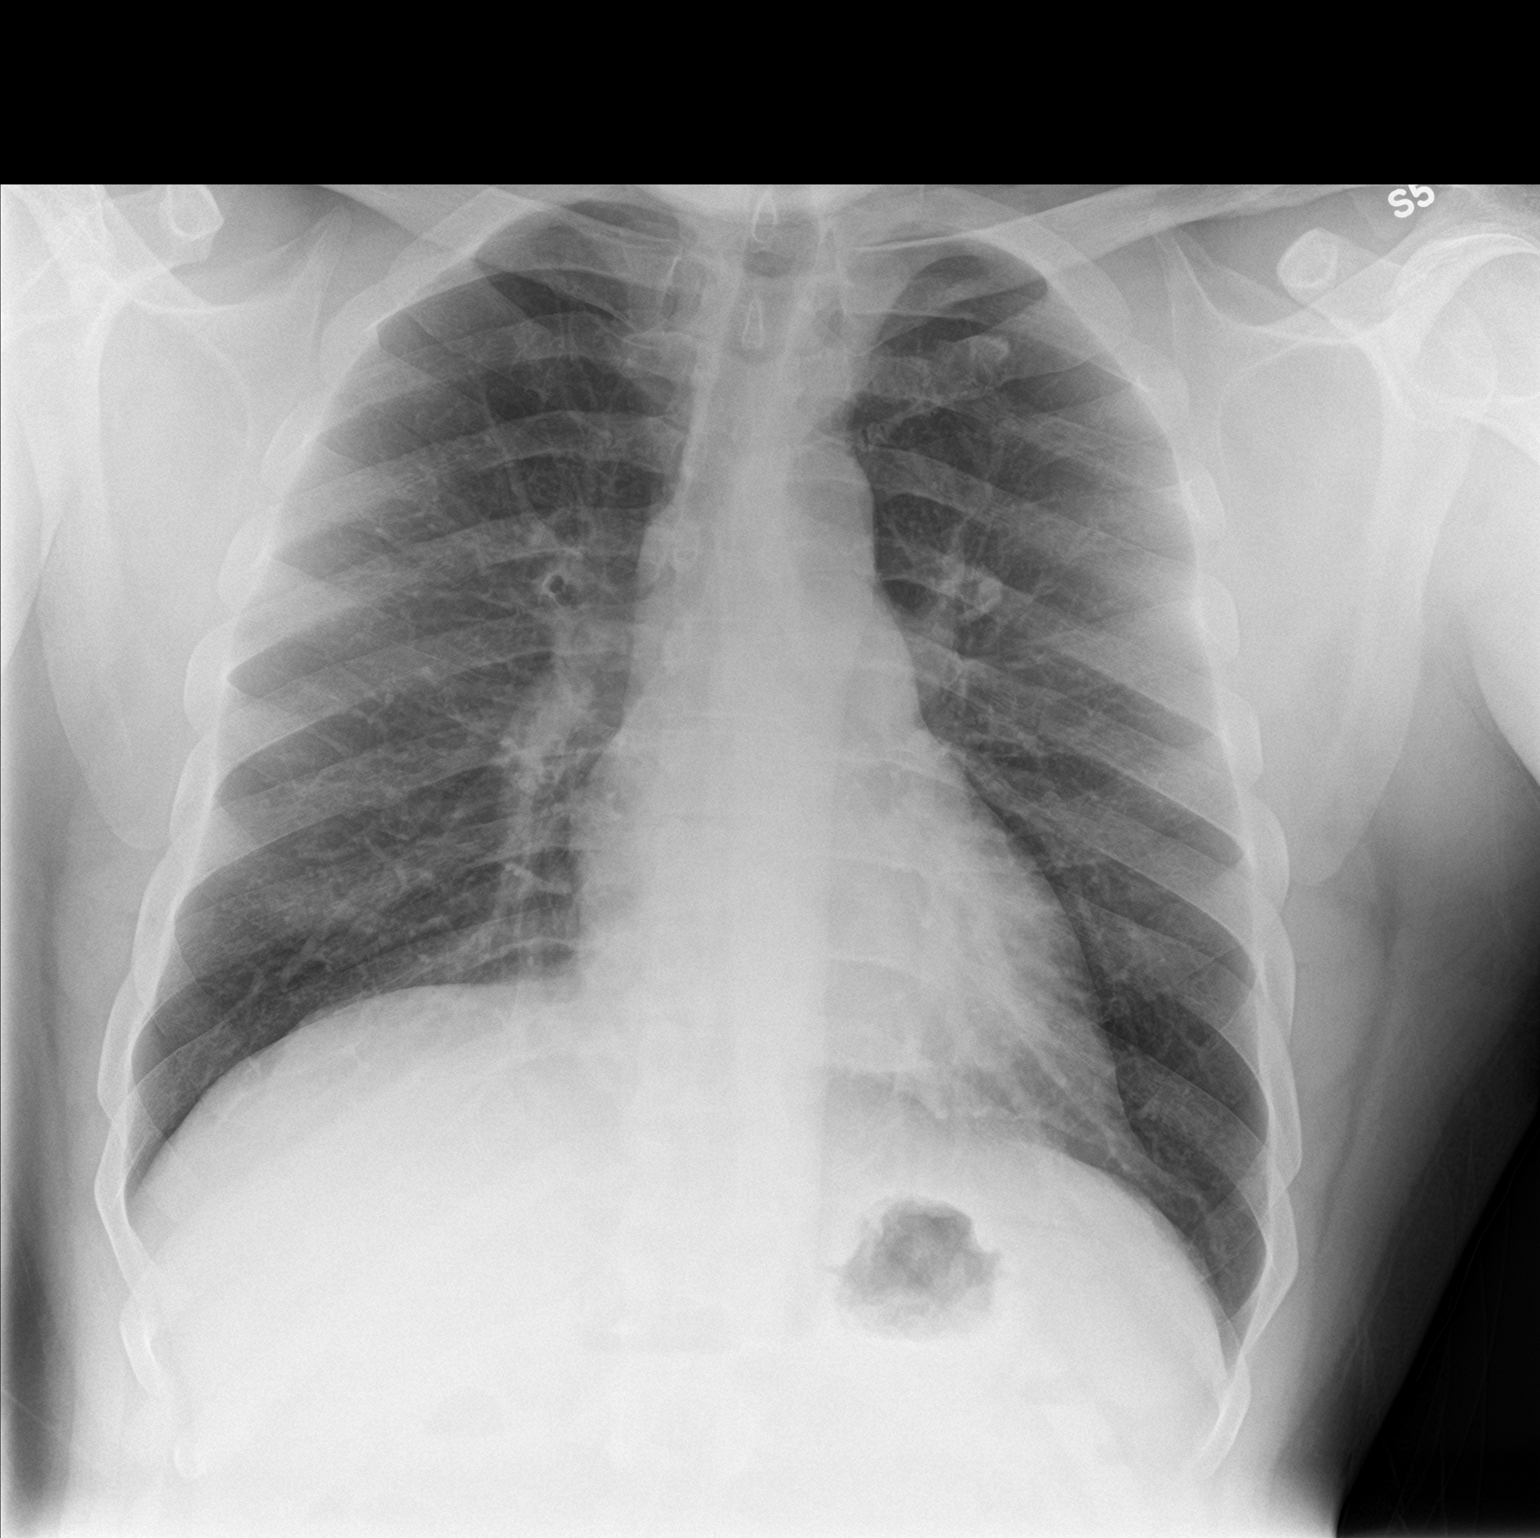

[chest lat]
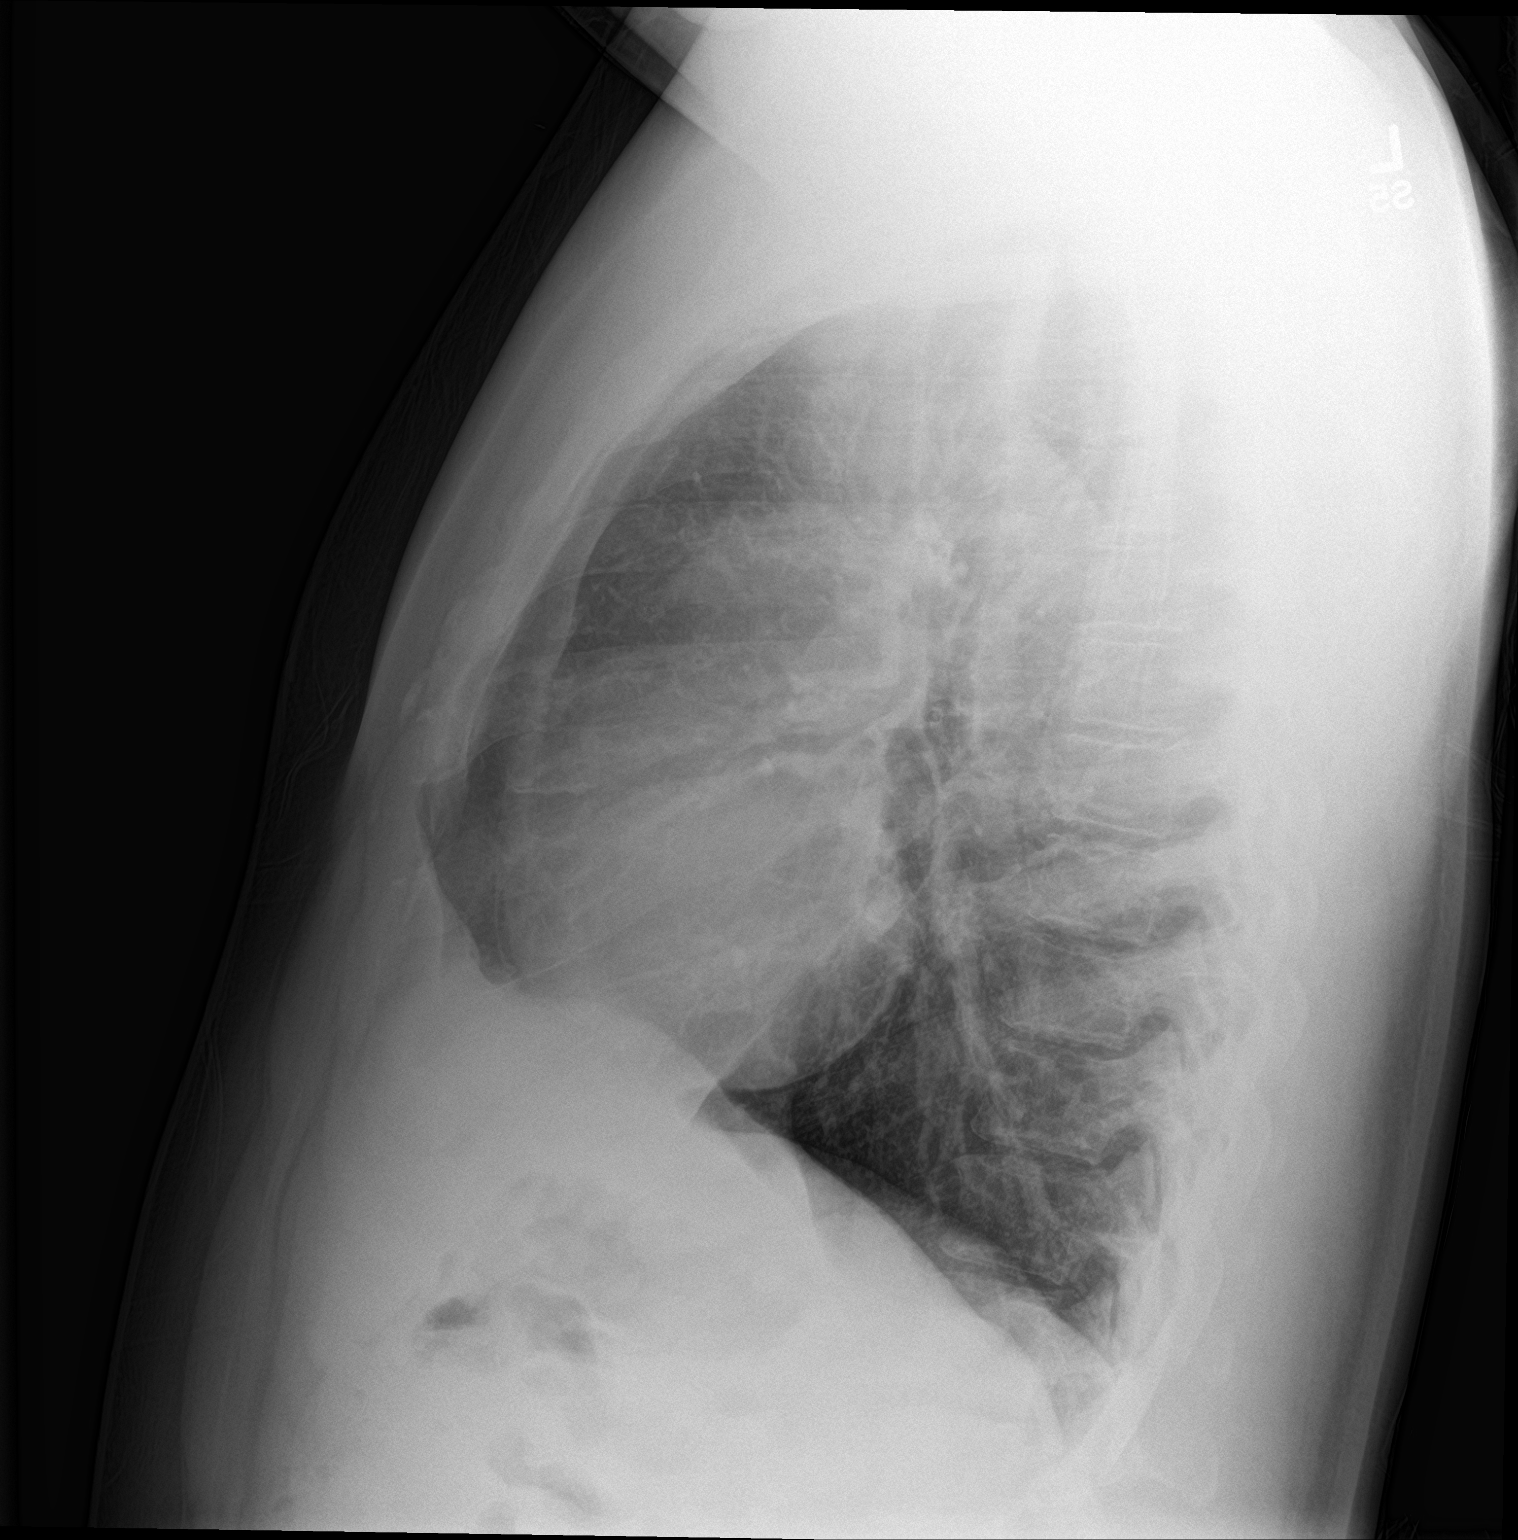

[2 of 2 positions shown; findings below may reference images not displayed]

FINDINGS: The heart size and mediastinal contours are within normal limits.
Both lungs are clear. The visualized skeletal structures are
unremarkable.
IMPRESSION: No active cardiopulmonary disease.

## 2017-08-13 ENCOUNTER — Other Ambulatory Visit (INDEPENDENT_AMBULATORY_CARE_PROVIDER_SITE_OTHER): Payer: Self-pay

## 2017-08-13 DIAGNOSIS — B20 Human immunodeficiency virus [HIV] disease: Secondary | ICD-10-CM

## 2017-08-13 DIAGNOSIS — Z113 Encounter for screening for infections with a predominantly sexual mode of transmission: Secondary | ICD-10-CM

## 2017-08-14 LAB — COMPLETE METABOLIC PANEL WITH GFR
ALT: 35 U/L (ref 9–46)
AST: 31 U/L (ref 10–40)
Albumin: 4.3 g/dL (ref 3.6–5.1)
Alkaline Phosphatase: 68 U/L (ref 40–115)
BUN: 13 mg/dL (ref 7–25)
CO2: 23 mmol/L (ref 20–32)
Calcium: 9.1 mg/dL (ref 8.6–10.3)
Chloride: 105 mmol/L (ref 98–110)
Creat: 0.86 mg/dL (ref 0.60–1.35)
Glucose, Bld: 140 mg/dL — ABNORMAL HIGH (ref 65–99)
Potassium: 4.1 mmol/L (ref 3.5–5.3)
Sodium: 139 mmol/L (ref 135–146)
TOTAL PROTEIN: 6.8 g/dL (ref 6.1–8.1)
Total Bilirubin: 0.3 mg/dL (ref 0.2–1.2)

## 2017-08-14 LAB — RPR

## 2017-08-14 LAB — T-HELPER CELL (CD4) - (RCID CLINIC ONLY)
CD4 T CELL ABS: 940 /uL (ref 400–2700)
CD4 T CELL HELPER: 43 % (ref 33–55)

## 2017-08-14 LAB — CBC WITH DIFFERENTIAL/PLATELET
BASOS ABS: 0 {cells}/uL (ref 0–200)
Basophils Relative: 0 %
EOS ABS: 44 {cells}/uL (ref 15–500)
Eosinophils Relative: 1 %
HCT: 45.8 % (ref 38.5–50.0)
Hemoglobin: 15 g/dL (ref 13.2–17.1)
LYMPHS PCT: 43 %
Lymphs Abs: 1892 cells/uL (ref 850–3900)
MCH: 29.5 pg (ref 27.0–33.0)
MCHC: 32.8 g/dL (ref 32.0–36.0)
MCV: 90.2 fL (ref 80.0–100.0)
MONOS PCT: 10 %
MPV: 10.3 fL (ref 7.5–12.5)
Monocytes Absolute: 440 cells/uL (ref 200–950)
Neutro Abs: 2024 cells/uL (ref 1500–7800)
Neutrophils Relative %: 46 %
PLATELETS: 201 10*3/uL (ref 140–400)
RBC: 5.08 MIL/uL (ref 4.20–5.80)
RDW: 14.4 % (ref 11.0–15.0)
WBC: 4.4 10*3/uL (ref 3.8–10.8)

## 2017-08-15 LAB — HIV-1 RNA QUANT-NO REFLEX-BLD
HIV 1 RNA QUANT: NOT DETECTED {copies}/mL
HIV-1 RNA Quant, Log: <1.3 Log copies/mL

## 2017-08-16 ENCOUNTER — Encounter: Payer: Self-pay | Admitting: Internal Medicine

## 2017-08-22 ENCOUNTER — Encounter: Payer: Self-pay | Admitting: Internal Medicine

## 2017-08-22 ENCOUNTER — Ambulatory Visit (INDEPENDENT_AMBULATORY_CARE_PROVIDER_SITE_OTHER): Payer: Self-pay | Admitting: Internal Medicine

## 2017-08-22 VITALS — BP 130/77 | HR 75 | Temp 98.3°F | Wt 240.0 lb

## 2017-08-22 DIAGNOSIS — Z720 Tobacco use: Secondary | ICD-10-CM

## 2017-08-22 DIAGNOSIS — Z23 Encounter for immunization: Secondary | ICD-10-CM

## 2017-08-22 DIAGNOSIS — Z716 Tobacco abuse counseling: Secondary | ICD-10-CM

## 2017-08-22 DIAGNOSIS — Z3009 Encounter for other general counseling and advice on contraception: Secondary | ICD-10-CM

## 2017-08-22 DIAGNOSIS — B20 Human immunodeficiency virus [HIV] disease: Secondary | ICD-10-CM

## 2017-08-22 MED ORDER — VARENICLINE TARTRATE 0.5 MG X 11 & 1 MG X 42 PO MISC: ORAL | 0 refills | Status: DC

## 2017-08-22 MED ORDER — VARENICLINE TARTRATE 1 MG PO TABS: 1.0000 mg | ORAL_TABLET | Freq: Two times a day (BID) | ORAL | 3 refills | Status: DC

## 2017-08-22 NOTE — Progress Notes (Signed)
RFV: follow up for HIV disease  Patient ID: John Dixon, male   DOB: 20-Aug-1976, 41 y.o.   MRN: [id number]  HPI 40yoM with hiv disease, currently on genvoya, CD 4 count of 940/VL<20 in sep 2018, doing well with adherence. He is here with his significant other who want to discuss family planning. Currently smoking 1/2 ppd and wants to quiet smoking  Outpatient Encounter Prescriptions as of 08/22/2017  Medication Sig  . GENVOYA 150-150-200-10 MG TABS tablet TAKE 1 TABLET BY MOUTH DAILY WITH BREAKFAST  . rosuvastatin (CRESTOR) 10 MG tablet Take 1 tablet (10 mg total) by mouth daily.   No facility-administered encounter medications on file as of 08/22/2017.      Patient Active Problem List   Diagnosis Date Noted  . Asymptomatic HIV infection (HCC) 09/22/2014     Health Maintenance Due  Topic Date Due  . INFLUENZA VACCINE  07/10/2017    Social History  Substance Use Topics  . Smoking status: Current Every Day Smoker    Packs/day: 0.50    Years: 4.00    Types: Cigarettes  . Smokeless tobacco: Never Used     Comment: cutting back  . Alcohol use No   Review of Systems Review of Systems  Constitutional: Negative for fever, chills, diaphoresis, activity change, appetite change, fatigue and unexpected weight change.  HENT: Negative for congestion, sore throat, rhinorrhea, sneezing, trouble swallowing and sinus pressure.  Eyes: Negative for photophobia and visual disturbance.  Respiratory: Negative for cough, chest tightness, shortness of breath, wheezing and stridor.  Cardiovascular: Negative for chest pain, palpitations and leg swelling.  Gastrointestinal: Negative for nausea, vomiting, abdominal pain, diarrhea, constipation, blood in stool, abdominal distention and anal bleeding.  Genitourinary: Negative for dysuria, hematuria, flank pain and difficulty urinating.  Musculoskeletal: Negative for myalgias, back pain, joint swelling, arthralgias and gait problem.  Skin:  Negative for color change, pallor, rash and wound.  Neurological: Negative for dizziness, tremors, weakness and light-headedness.  Hematological: Negative for adenopathy. Does not bruise/bleed easily.  Psychiatric/Behavioral: Negative for behavioral problems, confusion, sleep disturbance, dysphoric mood, decreased concentration and agitation.    Physical Exam   BP 130/77   Pulse 75   Temp 98.3 F (36.8 C) (Oral)   Wt 240 lb (108.9 kg)   BMI 32.55 kg/m   Physical Exam  Constitutional: He is oriented to person, place, and time. He appears well-developed and well-nourished. No distress.  HENT:  Mouth/Throat: Oropharynx is clear and moist. No oropharyngeal exudate.  Cardiovascular: Normal rate, regular rhythm and normal heart sounds. Exam reveals no gallop and no friction rub.  No murmur heard.  Pulmonary/Chest: Effort normal and breath sounds normal. No respiratory distress. He has no wheezes.  Abdominal: Soft. Bowel sounds are normal. He exhibits no distension. There is no tenderness.  Lymphadenopathy:  He has no cervical adenopathy.  Neurological: He is alert and oriented to person, place, and time.  Skin: Skin is warm and dry. No rash noted. No erythema.  Psychiatric: He has a normal mood and affect. His behavior is normal.    Lab Results  Component Value Date   CD4TCELL 43 08/13/2017   Lab Results  Component Value Date   CD4TABS 940 08/13/2017   CD4TABS 1,020 01/28/2017   CD4TABS 880 11/05/2016   Lab Results  Component Value Date   HIV1RNAQUANT <20 NOT DETECTED 08/13/2017   Lab Results  Component Value Date   HEPBSAB NEG 09/02/2014   Lab Results  Component Value Date   LABRPR  NON REAC 08/13/2017    CBC Lab Results  Component Value Date   WBC 4.4 08/13/2017   RBC 5.08 08/13/2017   HGB 15.0 08/13/2017   HCT 45.8 08/13/2017   PLT 201 08/13/2017   MCV 90.2 08/13/2017   MCH 29.5 08/13/2017   MCHC 32.8 08/13/2017   RDW 14.4 08/13/2017   LYMPHSABS 1,892  08/13/2017   MONOABS 440 08/13/2017   EOSABS 44 08/13/2017    BMET Lab Results  Component Value Date   NA 139 08/13/2017   K 4.1 08/13/2017   CL 105 08/13/2017   CO2 23 08/13/2017   GLUCOSE 140 (H) 08/13/2017   BUN 13 08/13/2017   CREATININE 0.86 08/13/2017   CALCIUM 9.1 08/13/2017   GFRNONAA >89 08/13/2017   GFRAA >89 08/13/2017      Assessment and Plan  hiv disease = well controlled, continue with - genvoya  Health maintenance - flu shot  Family planning = discussed that given that John Dixon is always well controlled, the risk of him giving hiv to see significant other is zero, but she is still concern and would like to try PrEP while they attempt to conceive. - will have SO meet with pharmacy for PrEP  Smoking cessation counseling= spent 10 min on smoking cessation. Will prescribe chantix
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
## 2017-11-11 ENCOUNTER — Other Ambulatory Visit: Payer: Self-pay | Admitting: *Deleted

## 2017-11-11 ENCOUNTER — Other Ambulatory Visit: Payer: Self-pay

## 2017-11-11 DIAGNOSIS — Z113 Encounter for screening for infections with a predominantly sexual mode of transmission: Secondary | ICD-10-CM

## 2017-11-11 DIAGNOSIS — Z21 Asymptomatic human immunodeficiency virus [HIV] infection status: Secondary | ICD-10-CM

## 2017-11-25 ENCOUNTER — Encounter: Payer: Self-pay | Admitting: Internal Medicine

## 2017-11-25 ENCOUNTER — Ambulatory Visit (INDEPENDENT_AMBULATORY_CARE_PROVIDER_SITE_OTHER): Payer: Medicaid Other | Admitting: Internal Medicine

## 2017-11-25 VITALS — BP 127/80 | HR 75 | Temp 97.5°F | Ht 72.0 in | Wt 241.0 lb

## 2017-11-25 DIAGNOSIS — B20 Human immunodeficiency virus [HIV] disease: Secondary | ICD-10-CM

## 2017-11-25 DIAGNOSIS — Z79899 Other long term (current) drug therapy: Secondary | ICD-10-CM | POA: Diagnosis not present

## 2017-11-25 DIAGNOSIS — Z716 Tobacco abuse counseling: Secondary | ICD-10-CM

## 2017-11-25 NOTE — Progress Notes (Signed)
RFV: follow up for hiv disease  Patient ID: John OvensLameek Dixon, male   DOB: 1976-04-01, 41 y.o.   MRN: [id number]  HPI John Dixon is a 41yo M with well controlled hiv disease, HLD, CD 4 count of 940/VL<20 on genvoya. He is starting to pay $6 copay for his medication. He is doing well with his health. Continues to have excellent adherence.  He has cut back on smoking cigarettes. He did not take chantix after reading SE profile.   sochx = smokes mj. He does not notice any side effects of paranoia with smoking marijuana. No other illicit drugs  Outpatient Encounter Medications as of 11/25/2017  Medication Sig  . GENVOYA 150-150-200-10 MG TABS tablet TAKE 1 TABLET BY MOUTH DAILY WITH BREAKFAST  . rosuvastatin (CRESTOR) 10 MG tablet Take 1 tablet (10 mg total) by mouth daily.  . varenicline (CHANTIX CONTINUING MONTH PAK) 1 MG tablet Take 1 tablet (1 mg total) by mouth 2 (two) times daily.  . varenicline (CHANTIX STARTING MONTH PAK) 0.5 MG X 11 & 1 MG X 42 tablet Take one 0.5 mg tablet by mouth daily x 3 days, then increase to one 0.5 mg tablet 2x/day for 4 days, then increase to 1 mg tablet twice daily.   No facility-administered encounter medications on file as of 11/25/2017.      Patient Active Problem List   Diagnosis Date Noted  . Asymptomatic HIV infection (HCC) 09/22/2014     There are no preventive care reminders to display for this patient.   Review of Systems Review of Systems  Constitutional: Negative for fever, chills, diaphoresis, activity change, appetite change, fatigue and unexpected weight change.  HENT: Negative for congestion, sore throat, rhinorrhea, sneezing, trouble swallowing and sinus pressure.  Eyes: Negative for photophobia and visual disturbance.  Respiratory: Negative for cough, chest tightness, shortness of breath, wheezing and stridor.  Cardiovascular: Negative for chest pain, palpitations and leg swelling.  Gastrointestinal: Negative for nausea, vomiting,  abdominal pain, diarrhea, constipation, blood in stool, abdominal distention and anal bleeding.  Genitourinary: Negative for dysuria, hematuria, flank pain and difficulty urinating.  Musculoskeletal: Negative for myalgias, back pain, joint swelling, arthralgias and gait problem.  Skin: Negative for color change, pallor, rash and wound.  Neurological: Negative for dizziness, tremors, weakness and light-headedness.  Hematological: Negative for adenopathy. Does not bruise/bleed easily.  Psychiatric/Behavioral: Negative for behavioral problems, confusion, sleep disturbance, dysphoric mood, decreased concentration and agitation.    Physical Exam  BP 127/80   Pulse 75   Temp (!) 97.5 F (36.4 C) (Oral)   Ht 6' (1.829 m)   Wt 241 lb (109.3 kg)   BMI 32.69 kg/m  Physical Exam  Constitutional: He is oriented to person, place, and time. He appears well-developed and well-nourished. No distress.  HENT: eyes are injected Mouth/Throat: Oropharynx is clear and moist. No oropharyngeal exudate.  Cardiovascular: Normal rate, regular rhythm and normal heart sounds. Exam reveals no gallop and no friction rub.  No murmur heard.  Pulmonary/Chest: Effort normal and breath sounds normal. No respiratory distress. He has no wheezes.  Lymphadenopathy:  He has no cervical adenopathy.  Neurological: He is alert and oriented to person, place, and time.  Skin: Skin is warm and dry. No rash noted. No erythema.  Psychiatric: He has a normal mood and affect. His behavior is normal.     Lab Results  Component Value Date   CD4TCELL 43 08/13/2017   Lab Results  Component Value Date   CD4TABS 940 08/13/2017  CD4TABS 1,020 01/28/2017   CD4TABS 880 11/05/2016   Lab Results  Component Value Date   HIV1RNAQUANT <20 NOT DETECTED 08/13/2017   Lab Results  Component Value Date   HEPBSAB NEG 09/02/2014   Lab Results  Component Value Date   LABRPR NON REAC 08/13/2017    CBC Lab Results  Component Value  Date   WBC 4.4 08/13/2017   RBC 5.08 08/13/2017   HGB 15.0 08/13/2017   HCT 45.8 08/13/2017   PLT 201 08/13/2017   MCV 90.2 08/13/2017   MCH 29.5 08/13/2017   MCHC 32.8 08/13/2017   RDW 14.4 08/13/2017   LYMPHSABS 1,892 08/13/2017   MONOABS 440 08/13/2017   EOSABS 44 08/13/2017    BMET Lab Results  Component Value Date   NA 139 08/13/2017   K 4.1 08/13/2017   CL 105 08/13/2017   CO2 23 08/13/2017   GLUCOSE 140 (H) 08/13/2017   BUN 13 08/13/2017   CREATININE 0.86 08/13/2017   CALCIUM 9.1 08/13/2017   GFRNONAA >89 08/13/2017   GFRAA >89 08/13/2017      Assessment and Plan  hiv - doing well. Continue on genovya We will see back in 3 month, labs 2 wk before  Smoking cessation = recommend to try to quit tobacco completely. Spent 5 min in counseling.He is still pre-contemplative and gave ideas for how to cut back  Health maintenance = will need meningococal booster at next visit

## 2017-11-25 NOTE — Patient Instructions (Signed)
Come in 2 wk prior to next visit for your labs

## 2018-01-16 ENCOUNTER — Other Ambulatory Visit: Payer: Self-pay | Admitting: Internal Medicine

## 2018-01-16 DIAGNOSIS — B20 Human immunodeficiency virus [HIV] disease: Secondary | ICD-10-CM

## 2018-02-11 ENCOUNTER — Other Ambulatory Visit: Payer: Self-pay | Admitting: Internal Medicine

## 2018-02-25 ENCOUNTER — Ambulatory Visit: Payer: Self-pay | Admitting: Internal Medicine

## 2018-03-04 ENCOUNTER — Ambulatory Visit: Payer: Self-pay | Admitting: Internal Medicine

## 2018-04-04 ENCOUNTER — Other Ambulatory Visit: Payer: Self-pay | Admitting: Internal Medicine

## 2018-04-04 DIAGNOSIS — B20 Human immunodeficiency virus [HIV] disease: Secondary | ICD-10-CM

## 2018-06-30 ENCOUNTER — Telehealth: Payer: Self-pay

## 2018-06-30 NOTE — Telephone Encounter (Signed)
PT left a vm for a callback did not leave a reason just left his name, DOB, and phone number. Attempted to call pt back regarding this call, but was  Unable to reach pt nor leave a vm.  Lorenso CourierJose L Maldonado, [place]

## 2018-07-31 ENCOUNTER — Other Ambulatory Visit: Payer: Medicaid Other

## 2018-07-31 DIAGNOSIS — B20 Human immunodeficiency virus [HIV] disease: Secondary | ICD-10-CM | POA: Diagnosis not present

## 2018-08-01 LAB — T-HELPER CELL (CD4) - (RCID CLINIC ONLY)
CD4 % Helper T Cell: 39 % (ref 33–55)
CD4 T Cell Abs: 1080 /uL (ref 400–2700)

## 2018-08-03 LAB — CBC WITH DIFFERENTIAL/PLATELET
BASOS ABS: 32 {cells}/uL (ref 0–200)
Basophils Relative: 0.6 %
Eosinophils Absolute: 49 cells/uL (ref 15–500)
Eosinophils Relative: 0.9 %
HCT: 44.4 % (ref 38.5–50.0)
Hemoglobin: 15.1 g/dL (ref 13.2–17.1)
Lymphs Abs: 2657 cells/uL (ref 850–3900)
MCH: 30.1 pg (ref 27.0–33.0)
MCHC: 34 g/dL (ref 32.0–36.0)
MCV: 88.4 fL (ref 80.0–100.0)
MPV: 11.1 fL (ref 7.5–12.5)
Monocytes Relative: 13.2 %
Neutro Abs: 1949 cells/uL (ref 1500–7800)
Neutrophils Relative %: 36.1 %
PLATELETS: 198 10*3/uL (ref 140–400)
RBC: 5.02 10*6/uL (ref 4.20–5.80)
RDW: 13.6 % (ref 11.0–15.0)
TOTAL LYMPHOCYTE: 49.2 %
WBC: 5.4 10*3/uL (ref 3.8–10.8)
WBCMIX: 713 {cells}/uL (ref 200–950)

## 2018-08-03 LAB — BASIC METABOLIC PANEL
BUN: 15 mg/dL (ref 7–25)
CALCIUM: 9.5 mg/dL (ref 8.6–10.3)
CO2: 27 mmol/L (ref 20–32)
CREATININE: 1.03 mg/dL (ref 0.60–1.35)
Chloride: 106 mmol/L (ref 98–110)
GLUCOSE: 98 mg/dL (ref 65–99)
Potassium: 4.2 mmol/L (ref 3.5–5.3)
SODIUM: 140 mmol/L (ref 135–146)

## 2018-08-03 LAB — HIV-1 RNA QUANT-NO REFLEX-BLD
HIV 1 RNA Quant: <20 copies/mL
HIV-1 RNA QUANT, LOG: NOT DETECTED {Log_copies}/mL

## 2018-08-03 LAB — RPR: RPR Ser Ql: NONREACTIVE

## 2018-08-19 ENCOUNTER — Ambulatory Visit (INDEPENDENT_AMBULATORY_CARE_PROVIDER_SITE_OTHER): Payer: Medicaid Other | Admitting: Internal Medicine

## 2018-08-19 DIAGNOSIS — Z716 Tobacco abuse counseling: Secondary | ICD-10-CM | POA: Diagnosis not present

## 2018-08-19 DIAGNOSIS — E7800 Pure hypercholesterolemia, unspecified: Secondary | ICD-10-CM

## 2018-08-19 DIAGNOSIS — B20 Human immunodeficiency virus [HIV] disease: Secondary | ICD-10-CM | POA: Diagnosis not present

## 2018-08-19 DIAGNOSIS — Z23 Encounter for immunization: Secondary | ICD-10-CM

## 2018-08-19 LAB — LIPID PANEL
CHOL/HDL RATIO: 5.3 (calc) — AB (ref <5.0)
CHOLESTEROL: 200 mg/dL — AB (ref <200)
HDL: 38 mg/dL — AB (ref >40)
LDL Cholesterol (Calc): 131 mg/dL (calc) — ABNORMAL HIGH
NON-HDL CHOLESTEROL (CALC): 162 mg/dL — AB (ref <130)
Triglycerides: 177 mg/dL — ABNORMAL HIGH (ref <150)

## 2018-08-19 MED ORDER — ELVITEG-COBIC-EMTRICIT-TENOFAF 150-150-200-10 MG PO TABS: 1.0000 | ORAL_TABLET | Freq: Every day | ORAL | 5 refills | Status: DC

## 2018-08-19 NOTE — Progress Notes (Signed)
RFV: follow up for hiv disease  Patient ID: John Dixon, male   DOB: May 30, 1976, 42 y.o.   MRN: [id number]  HPI 42yo M with well controlled hiv disease, Cd 4 count 1080/VL<20 on genvoya. He reports having good health not had any UC or ED visits. His weight has been stable. Trying to eat less fried foods.  He does report Smoking 10 cigs per day. Maintains in same relationship with his wife.  Outpatient Encounter Medications as of 08/19/2018  Medication Sig  . GENVOYA 150-150-200-10 MG TABS tablet TAKE 1 TABLET BY MOUTH DAILY WITH BREAKFAST  . rosuvastatin (CRESTOR) 10 MG tablet TAKE 1 TABLET(10 MG) BY MOUTH DAILY  . varenicline (CHANTIX CONTINUING MONTH PAK) 1 MG tablet Take 1 tablet (1 mg total) by mouth 2 (two) times daily. (Patient not taking: Reported on 11/25/2017)  . varenicline (CHANTIX STARTING MONTH PAK) 0.5 MG X 11 & 1 MG X 42 tablet Take one 0.5 mg tablet by mouth daily x 3 days, then increase to one 0.5 mg tablet 2x/day for 4 days, then increase to 1 mg tablet twice daily. (Patient not taking: Reported on 11/25/2017)   No facility-administered encounter medications on file as of 08/19/2018.      Patient Active Problem List   Diagnosis Date Noted  . Asymptomatic HIV infection (HCC) 09/22/2014     Health Maintenance Due  Topic Date Due  . INFLUENZA VACCINE  07/10/2018     Review of Systems Review of Systems  Constitutional: Negative for fever, chills, diaphoresis, activity change, appetite change, fatigue and unexpected weight change.  HENT: Negative for congestion, sore throat, rhinorrhea, sneezing, trouble swallowing and sinus pressure.  Eyes: Negative for photophobia and visual disturbance.  Respiratory: Negative for cough, chest tightness, shortness of breath, wheezing and stridor.  Cardiovascular: Negative for chest pain, palpitations and leg swelling.  Gastrointestinal: Negative for nausea, vomiting, abdominal pain, diarrhea, constipation, blood in stool,  abdominal distention and anal bleeding.  Genitourinary: Negative for dysuria, hematuria, flank pain and difficulty urinating.  Musculoskeletal: Negative for myalgias, back pain, joint swelling, arthralgias and gait problem.  Skin: Negative for color change, pallor, rash and wound.  Neurological: Negative for dizziness, tremors, weakness and light-headedness.  Hematological: Negative for adenopathy. Does not bruise/bleed easily.  Psychiatric/Behavioral: Negative for behavioral problems, confusion, sleep disturbance, dysphoric mood, decreased concentration and agitation.    Physical Exam  There were no vitals taken for this visit. Physical Exam  Constitutional: He is oriented to person, place, and time. He appears well-developed and well-nourished. No distress.  HENT:  Mouth/Throat: Oropharynx is clear and moist. No oropharyngeal exudate.  Cardiovascular: Normal rate, regular rhythm and normal heart sounds. Exam reveals no gallop and no friction rub.  No murmur heard.  Pulmonary/Chest: Effort normal and breath sounds normal. No respiratory distress. He has no wheezes.  Abdominal: Soft. Bowel sounds are normal. He exhibits no distension. There is no tenderness.  Lymphadenopathy:  He has no cervical adenopathy.  Neurological: He is alert and oriented to person, place, and time.  Skin: Skin is warm and dry. No rash noted. No erythema.  Psychiatric: He has a normal mood and affect. His behavior is normal.     Lab Results  Component Value Date   CD4TCELL 40 07/31/2018   Lab Results  Component Value Date   CD4TABS 1,080 07/31/2018   CD4TABS 940 08/13/2017   CD4TABS 1,020 01/28/2017   Lab Results  Component Value Date   HIV1RNAQUANT <20 NOT DETECTED 07/31/2018   Lab  Results  Component Value Date   HEPBSAB NEG 09/02/2014   Lab Results  Component Value Date   LABRPR NON-REACTIVE 07/31/2018    CBC Lab Results  Component Value Date   WBC 5.4 07/31/2018   RBC 5.02 07/31/2018     HGB 15.1 07/31/2018   HCT 44.4 07/31/2018   PLT 198 07/31/2018   MCV 88.4 07/31/2018   MCH 30.1 07/31/2018   MCHC 34.0 07/31/2018   RDW 13.6 07/31/2018   LYMPHSABS 2,657 07/31/2018   MONOABS 440 08/13/2017   EOSABS 49 07/31/2018    BMET Lab Results  Component Value Date   NA 140 07/31/2018   K 4.2 07/31/2018   CL 106 07/31/2018   CO2 27 07/31/2018   GLUCOSE 98 07/31/2018   BUN 15 07/31/2018   CREATININE 1.03 07/31/2018   CALCIUM 9.5 07/31/2018   GFRNONAA >89 08/13/2017   GFRAA >89 08/13/2017    Assessment and Plan  hiv disease =continue on genvoya  hld = will check lipid profile, can continue on rosuvastatin  Smoking cessation = counseling to help decrease smoking, spent 5 min. Pre contemplative  Health maintenance = will give flu shot today

## 2018-11-25 ENCOUNTER — Other Ambulatory Visit: Payer: Self-pay | Admitting: Internal Medicine

## 2018-11-25 DIAGNOSIS — B20 Human immunodeficiency virus [HIV] disease: Secondary | ICD-10-CM

## 2019-01-28 ENCOUNTER — Other Ambulatory Visit: Payer: Medicaid Other

## 2019-01-28 ENCOUNTER — Other Ambulatory Visit: Payer: Self-pay

## 2019-01-28 DIAGNOSIS — Z79899 Other long term (current) drug therapy: Secondary | ICD-10-CM

## 2019-01-28 DIAGNOSIS — B20 Human immunodeficiency virus [HIV] disease: Secondary | ICD-10-CM

## 2019-01-28 DIAGNOSIS — Z113 Encounter for screening for infections with a predominantly sexual mode of transmission: Secondary | ICD-10-CM

## 2019-01-29 LAB — T-HELPER CELL (CD4) - (RCID CLINIC ONLY)
CD4 % Helper T Cell: 42 % (ref 33–55)
CD4 T Cell Abs: 1120 /uL (ref 400–2700)

## 2019-01-30 LAB — COMPREHENSIVE METABOLIC PANEL
AG Ratio: 1.5 (calc) (ref 1.0–2.5)
ALT: 51 U/L — AB (ref 9–46)
AST: 24 U/L (ref 10–40)
Albumin: 4.4 g/dL (ref 3.6–5.1)
Alkaline phosphatase (APISO): 79 U/L (ref 36–130)
BUN: 14 mg/dL (ref 7–25)
CO2: 27 mmol/L (ref 20–32)
Calcium: 10.1 mg/dL (ref 8.6–10.3)
Chloride: 104 mmol/L (ref 98–110)
Creat: 0.94 mg/dL (ref 0.60–1.35)
Globulin: 2.9 g/dL (calc) (ref 1.9–3.7)
Glucose, Bld: 95 mg/dL (ref 65–99)
Potassium: 4.4 mmol/L (ref 3.5–5.3)
Sodium: 138 mmol/L (ref 135–146)
Total Bilirubin: 0.4 mg/dL (ref 0.2–1.2)
Total Protein: 7.3 g/dL (ref 6.1–8.1)

## 2019-01-30 LAB — CBC WITH DIFFERENTIAL/PLATELET
Absolute Monocytes: 673 cells/uL (ref 200–950)
BASOS PCT: 0.4 %
Basophils Absolute: 20 cells/uL (ref 0–200)
Eosinophils Absolute: 31 cells/uL (ref 15–500)
Eosinophils Relative: 0.6 %
HCT: 45.2 % (ref 38.5–50.0)
Hemoglobin: 15.5 g/dL (ref 13.2–17.1)
Lymphs Abs: 2576 cells/uL (ref 850–3900)
MCH: 29.9 pg (ref 27.0–33.0)
MCHC: 34.3 g/dL (ref 32.0–36.0)
MCV: 87.3 fL (ref 80.0–100.0)
MPV: 11.1 fL (ref 7.5–12.5)
Monocytes Relative: 13.2 %
NEUTROS ABS: 1800 {cells}/uL (ref 1500–7800)
Neutrophils Relative %: 35.3 %
Platelets: 199 10*3/uL (ref 140–400)
RBC: 5.18 10*6/uL (ref 4.20–5.80)
RDW: 13.4 % (ref 11.0–15.0)
Total Lymphocyte: 50.5 %
WBC: 5.1 10*3/uL (ref 3.8–10.8)

## 2019-01-30 LAB — HIV-1 RNA QUANT-NO REFLEX-BLD
HIV 1 RNA Quant: <20 copies/mL
HIV-1 RNA QUANT, LOG: NOT DETECTED {Log_copies}/mL

## 2019-02-17 ENCOUNTER — Encounter: Payer: Self-pay | Admitting: Internal Medicine

## 2019-02-17 ENCOUNTER — Ambulatory Visit (INDEPENDENT_AMBULATORY_CARE_PROVIDER_SITE_OTHER): Payer: Medicaid Other | Admitting: Internal Medicine

## 2019-02-17 VITALS — BP 160/96 | HR 74 | Temp 98.6°F | Wt 259.0 lb

## 2019-02-17 DIAGNOSIS — Z0000 Encounter for general adult medical examination without abnormal findings: Secondary | ICD-10-CM | POA: Diagnosis not present

## 2019-02-17 DIAGNOSIS — Z79899 Other long term (current) drug therapy: Secondary | ICD-10-CM

## 2019-02-17 DIAGNOSIS — B20 Human immunodeficiency virus [HIV] disease: Secondary | ICD-10-CM

## 2019-02-17 DIAGNOSIS — Z23 Encounter for immunization: Secondary | ICD-10-CM

## 2019-02-17 NOTE — Progress Notes (Signed)
RFV: follow up for hiv disease  Patient ID: John Dixon, male   DOB: 1976-11-24, 43 y.o.   MRN: [id number]  HPI  43yo M with hiv disease, CD 4 count of 1120/VL<20 on genvoya. Doing well. He is here at the visit with his significant other.  They are discussing potential for family planning. Keiton's girlfriend discussed her hpv status during this visit.  Outpatient Encounter Medications as of 02/17/2019  Medication Sig  . GENVOYA 150-150-200-10 MG TABS tablet TAKE 1 TABLET BY MOUTH DAILY WITH BREAKFAST  . rosuvastatin (CRESTOR) 10 MG tablet TAKE 1 TABLET(10 MG) BY MOUTH DAILY   No facility-administered encounter medications on file as of 02/17/2019.      Patient Active Problem List   Diagnosis Date Noted  . Asymptomatic HIV infection (HCC) 09/22/2014     There are no preventive care reminders to display for this patient.  Social History   Tobacco Use  . Smoking status: Current Every Day Smoker    Packs/day: 0.50    Years: 4.00    Pack years: 2.00    Types: Cigarettes  . Smokeless tobacco: Never Used  . Tobacco comment: cutting back  Substance Use Topics  . Alcohol use: No    Alcohol/week: 0.0 standard drinks  . Drug use: Yes    Types: Marijuana   Review of Systems Review of Systems  Constitutional: Negative for fever, chills, diaphoresis, activity change, appetite change, fatigue and unexpected weight change.  HENT: Negative for congestion, sore throat, rhinorrhea, sneezing, trouble swallowing and sinus pressure.  Eyes: Negative for photophobia and visual disturbance.  Respiratory: Negative for cough, chest tightness, shortness of breath, wheezing and stridor.  Cardiovascular: Negative for chest pain, palpitations and leg swelling.  Gastrointestinal: Negative for nausea, vomiting, abdominal pain, diarrhea, constipation, blood in stool, abdominal distention and anal bleeding.  Genitourinary: Negative for dysuria, hematuria, flank pain and difficulty urinating.    Musculoskeletal: Negative for myalgias, back pain, joint swelling, arthralgias and gait problem.  Skin: Negative for color change, pallor, rash and wound.  Neurological: Negative for dizziness, tremors, weakness and light-headedness.  Hematological: Negative for adenopathy. Does not bruise/bleed easily.  Psychiatric/Behavioral: Negative for behavioral problems, confusion, sleep disturbance, dysphoric mood, decreased concentration and agitation.    Physical Exam   BP (!) 160/96   Pulse 74   Temp 98.6 F (37 C) (Oral)   Wt 259 lb (117.5 kg)   BMI 35.13 kg/m   Physical Exam  Constitutional: He is oriented to person, place, and time. He appears well-developed and well-nourished. No distress.  HENT:  Mouth/Throat: Oropharynx is clear and moist. No oropharyngeal exudate.  Cardiovascular: Normal rate, regular rhythm and normal heart sounds. Exam reveals no gallop and no friction rub.  No murmur heard.  Pulmonary/Chest: Effort normal and breath sounds normal. No respiratory distress. He has no wheezes.  Abdominal: Soft. Bowel sounds are normal. He exhibits no distension. There is no tenderness.  Lymphadenopathy:  He has no cervical adenopathy.  Neurological: He is alert and oriented to person, place, and time.  Skin: Skin is warm and dry. No rash noted. No erythema.  Psychiatric: He has a normal mood and affect. His behavior is normal.    Lab Results  Component Value Date   CD4TCELL 42 01/28/2019   Lab Results  Component Value Date   CD4TABS 1,120 01/28/2019   CD4TABS 1,080 07/31/2018   CD4TABS 940 08/13/2017   Lab Results  Component Value Date   HIV1RNAQUANT <20 NOT DETECTED 01/28/2019  Lab Results  Component Value Date   HEPBSAB NEG 09/02/2014   Lab Results  Component Value Date   LABRPR NON-REACTIVE 07/31/2018    CBC Lab Results  Component Value Date   WBC 5.1 01/28/2019   RBC 5.18 01/28/2019   HGB 15.5 01/28/2019   HCT 45.2 01/28/2019   PLT 199 01/28/2019    MCV 87.3 01/28/2019   MCH 29.9 01/28/2019   MCHC 34.3 01/28/2019   RDW 13.4 01/28/2019   LYMPHSABS 2,576 01/28/2019   MONOABS 440 08/13/2017   EOSABS 31 01/28/2019    BMET Lab Results  Component Value Date   NA 138 01/28/2019   K 4.4 01/28/2019   CL 104 01/28/2019   CO2 27 01/28/2019   GLUCOSE 95 01/28/2019   BUN 14 01/28/2019   CREATININE 0.94 01/28/2019   CALCIUM 10.1 01/28/2019   GFRNONAA >89 08/13/2017   GFRAA >89 08/13/2017      Assessment and Plan  hiv disease= well controlled. Continue with genvoya  Health maintenance = will give hpv series  Long term medication management= cr stable. Continue on current regimen

## 2019-02-19 ENCOUNTER — Other Ambulatory Visit: Payer: Self-pay | Admitting: Internal Medicine

## 2019-04-29 ENCOUNTER — Ambulatory Visit (INDEPENDENT_AMBULATORY_CARE_PROVIDER_SITE_OTHER): Payer: Medicaid Other

## 2019-04-29 ENCOUNTER — Other Ambulatory Visit: Payer: Self-pay

## 2019-04-29 DIAGNOSIS — Z23 Encounter for immunization: Secondary | ICD-10-CM

## 2019-08-14 ENCOUNTER — Other Ambulatory Visit: Payer: Self-pay | Admitting: *Deleted

## 2019-08-14 DIAGNOSIS — Z113 Encounter for screening for infections with a predominantly sexual mode of transmission: Secondary | ICD-10-CM

## 2019-08-14 DIAGNOSIS — B20 Human immunodeficiency virus [HIV] disease: Secondary | ICD-10-CM

## 2019-08-20 ENCOUNTER — Other Ambulatory Visit (HOSPITAL_COMMUNITY)
Admission: RE | Admit: 2019-08-20 | Discharge: 2019-08-20 | Disposition: A | Discharge status: Home / Self Care | Payer: Medicaid Other | Source: Ambulatory Visit | Attending: Internal Medicine | Admitting: Internal Medicine

## 2019-08-20 ENCOUNTER — Other Ambulatory Visit: Payer: Self-pay

## 2019-08-20 ENCOUNTER — Other Ambulatory Visit: Payer: Medicaid Other

## 2019-08-20 DIAGNOSIS — B20 Human immunodeficiency virus [HIV] disease: Secondary | ICD-10-CM | POA: Diagnosis not present

## 2019-08-20 DIAGNOSIS — Z113 Encounter for screening for infections with a predominantly sexual mode of transmission: Secondary | ICD-10-CM | POA: Insufficient documentation

## 2019-08-21 ENCOUNTER — Other Ambulatory Visit: Payer: Self-pay | Admitting: Internal Medicine

## 2019-08-21 DIAGNOSIS — B20 Human immunodeficiency virus [HIV] disease: Secondary | ICD-10-CM

## 2019-08-21 LAB — T-HELPER CELL (CD4) - (RCID CLINIC ONLY)
CD4 % Helper T Cell: 44 % (ref 33–65)
CD4 T Cell Abs: 1130 /uL (ref 400–1790)

## 2019-08-22 LAB — URINE CYTOLOGY ANCILLARY ONLY
Chlamydia: NEGATIVE
Neisseria Gonorrhea: NEGATIVE

## 2019-08-25 LAB — COMPLETE METABOLIC PANEL WITH GFR
AG Ratio: 1.6 (calc) (ref 1.0–2.5)
ALT: 27 U/L (ref 9–46)
AST: 22 U/L (ref 10–40)
Albumin: 4.2 g/dL (ref 3.6–5.1)
Alkaline phosphatase (APISO): 60 U/L (ref 36–130)
BUN: 13 mg/dL (ref 7–25)
CO2: 27 mmol/L (ref 20–32)
Calcium: 9.1 mg/dL (ref 8.6–10.3)
Chloride: 106 mmol/L (ref 98–110)
Creat: 0.99 mg/dL (ref 0.60–1.35)
GFR, Est African American: 110 mL/min/{1.73_m2} (ref >=60)
GFR, Est Non African American: 95 mL/min/{1.73_m2} (ref >=60)
Globulin: 2.6 g/dL (calc) (ref 1.9–3.7)
Glucose, Bld: 106 mg/dL — ABNORMAL HIGH (ref 65–99)
Potassium: 4.3 mmol/L (ref 3.5–5.3)
Sodium: 139 mmol/L (ref 135–146)
Total Bilirubin: 0.3 mg/dL (ref 0.2–1.2)
Total Protein: 6.8 g/dL (ref 6.1–8.1)

## 2019-08-25 LAB — HIV-1 RNA QUANT-NO REFLEX-BLD
HIV 1 RNA Quant: <20 copies/mL
HIV-1 RNA Quant, Log: <1.3 Log copies/mL

## 2019-08-25 LAB — CBC WITH DIFFERENTIAL/PLATELET
Absolute Monocytes: 566 cells/uL (ref 200–950)
Basophils Absolute: 18 cells/uL (ref 0–200)
Basophils Relative: 0.4 %
Eosinophils Absolute: 41 cells/uL (ref 15–500)
Eosinophils Relative: 0.9 %
HCT: 45.4 % (ref 38.5–50.0)
Hemoglobin: 15.3 g/dL (ref 13.2–17.1)
Lymphs Abs: 2783 cells/uL (ref 850–3900)
MCH: 29.7 pg (ref 27.0–33.0)
MCHC: 33.7 g/dL (ref 32.0–36.0)
MCV: 88.2 fL (ref 80.0–100.0)
MPV: 11.4 fL (ref 7.5–12.5)
Monocytes Relative: 12.3 %
Neutro Abs: 1191 cells/uL — ABNORMAL LOW (ref 1500–7800)
Neutrophils Relative %: 25.9 %
Platelets: 189 10*3/uL (ref 140–400)
RBC: 5.15 10*6/uL (ref 4.20–5.80)
RDW: 13.2 % (ref 11.0–15.0)
Total Lymphocyte: 60.5 %
WBC: 4.6 10*3/uL (ref 3.8–10.8)

## 2019-08-25 LAB — RPR: RPR Ser Ql: NONREACTIVE

## 2019-09-03 ENCOUNTER — Ambulatory Visit (INDEPENDENT_AMBULATORY_CARE_PROVIDER_SITE_OTHER): Payer: Medicaid Other | Admitting: Internal Medicine

## 2019-09-03 ENCOUNTER — Encounter: Payer: Self-pay | Admitting: Internal Medicine

## 2019-09-03 ENCOUNTER — Other Ambulatory Visit: Payer: Self-pay

## 2019-09-03 VITALS — BP 128/80 | HR 78

## 2019-09-03 DIAGNOSIS — Z23 Encounter for immunization: Secondary | ICD-10-CM | POA: Diagnosis not present

## 2019-09-03 DIAGNOSIS — E7800 Pure hypercholesterolemia, unspecified: Secondary | ICD-10-CM

## 2019-09-03 DIAGNOSIS — B20 Human immunodeficiency virus [HIV] disease: Secondary | ICD-10-CM | POA: Diagnosis not present

## 2019-09-03 DIAGNOSIS — Z79899 Other long term (current) drug therapy: Secondary | ICD-10-CM | POA: Diagnosis not present

## 2019-09-03 NOTE — Progress Notes (Signed)
RFV: follow up for hiv disease  Patient ID: John Dixon, male   DOB: 1976/10/09, 43 y.o.   MRN: [id number]  HPI 42yo M, well-controlled hiv disease, cD 4 count 1130/VL<20( sep 2020), on genvoya.doing well with adherence to medication. He denies having any recent flu like illness, or covid contacts. Has been observant of masking.  Outpatient Encounter Medications as of 09/03/2019  Medication Sig  . GENVOYA 150-150-200-10 MG TABS tablet TAKE 1 TABLET BY MOUTH DAILY WITH BREAKFAST  . rosuvastatin (CRESTOR) 10 MG tablet TAKE 1 TABLET(10 MG) BY MOUTH DAILY   No facility-administered encounter medications on file as of 09/03/2019.      Patient Active Problem List   Diagnosis Date Noted  . Asymptomatic HIV infection (HCC) 09/22/2014     Health Maintenance Due  Topic Date Due  . INFLUENZA VACCINE  07/11/2019     Review of Systems Review of Systems  Constitutional: Negative for fever, chills, diaphoresis, activity change, appetite change, fatigue and unexpected weight change.  HENT: Negative for congestion, sore throat, rhinorrhea, sneezing, trouble swallowing and sinus pressure.  Eyes: Negative for photophobia and visual disturbance.  Respiratory: Negative for cough, chest tightness, shortness of breath, wheezing and stridor.  Cardiovascular: Negative for chest pain, palpitations and leg swelling.  Gastrointestinal: Negative for nausea, vomiting, abdominal pain, diarrhea, constipation, blood in stool, abdominal distention and anal bleeding.  Genitourinary: Negative for dysuria, hematuria, flank pain and difficulty urinating.  Musculoskeletal: Negative for myalgias, back pain, joint swelling, arthralgias and gait problem.  Skin: Negative for color change, pallor, rash and wound.  Neurological: Negative for dizziness, tremors, weakness and light-headedness.  Hematological: Negative for adenopathy. Does not bruise/bleed easily.  Psychiatric/Behavioral: Negative for behavioral  problems, confusion, sleep disturbance, dysphoric mood, decreased concentration and agitation.    Physical Exam   BP 128/80   Pulse 78  Physical Exam  Constitutional: He is oriented to person, place, and time. He appears well-developed and well-nourished. No distress.  HENT:  Mouth/Throat: Oropharynx is clear and moist. No oropharyngeal exudate.  Cardiovascular: Normal rate, regular rhythm and normal heart sounds. Exam reveals no gallop and no friction rub.  No murmur heard.  Pulmonary/Chest: Effort normal and breath sounds normal. No respiratory distress. He has no wheezes.  Lymphadenopathy:  He has no cervical adenopathy.  Neurological: He is alert and oriented to person, place, and time.  Skin: Skin is warm and dry. No rash noted. No erythema.  Psychiatric: He has a normal mood and affect. His behavior is normal.     Lab Results  Component Value Date   CD4TCELL 44 08/20/2019   Lab Results  Component Value Date   CD4TABS 1,130 08/20/2019   CD4TABS 1,120 01/28/2019   CD4TABS 1,080 07/31/2018   Lab Results  Component Value Date   HIV1RNAQUANT <20 NOT DETECTED 08/20/2019   Lab Results  Component Value Date   HEPBSAB NEG 09/02/2014   Lab Results  Component Value Date   LABRPR NON-REACTIVE 08/20/2019    CBC Lab Results  Component Value Date   WBC 4.6 08/20/2019   RBC 5.15 08/20/2019   HGB 15.3 08/20/2019   HCT 45.4 08/20/2019   PLT 189 08/20/2019   MCV 88.2 08/20/2019   MCH 29.7 08/20/2019   MCHC 33.7 08/20/2019   RDW 13.2 08/20/2019   LYMPHSABS 2,783 08/20/2019   MONOABS 440 08/13/2017   EOSABS 41 08/20/2019    BMET Lab Results  Component Value Date   NA 139 08/20/2019   K 4.3 08/20/2019  CL 106 08/20/2019   CO2 27 08/20/2019   GLUCOSE 106 (H) 08/20/2019   BUN 13 08/20/2019   CREATININE 0.99 08/20/2019   CALCIUM 9.1 08/20/2019   GFRNONAA 95 08/20/2019   GFRAA 110 08/20/2019      Assessment and Plan hiv disease= well controlled, plan to  continue with genvoya  Long term medication management = cr is stable on recent labs. Continue with genvoya  HLD = Check lipid profile next time, in meantime, continue with rosuvastatin  Health miantenance = flu vaccine today  Dental referral  rtc in 6 mo. Labs 2 wk prior

## 2020-02-01 ENCOUNTER — Other Ambulatory Visit: Payer: Self-pay

## 2020-02-01 MED ORDER — ROSUVASTATIN CALCIUM 10 MG PO TABS: ORAL_TABLET | ORAL | 2 refills | Status: DC

## 2020-02-10 ENCOUNTER — Other Ambulatory Visit: Payer: Medicaid Other

## 2020-02-10 ENCOUNTER — Other Ambulatory Visit: Payer: Self-pay

## 2020-02-10 DIAGNOSIS — B20 Human immunodeficiency virus [HIV] disease: Secondary | ICD-10-CM | POA: Diagnosis not present

## 2020-02-11 LAB — T-HELPER CELL (CD4) - (RCID CLINIC ONLY)
CD4 % Helper T Cell: 42 % (ref 33–65)
CD4 T Cell Abs: 1113 /uL (ref 400–1790)

## 2020-02-12 LAB — CBC WITH DIFFERENTIAL/PLATELET
Absolute Monocytes: 643 cells/uL (ref 200–950)
Basophils Absolute: 20 cells/uL (ref 0–200)
Basophils Relative: 0.4 %
Eosinophils Absolute: 71 cells/uL (ref 15–500)
Eosinophils Relative: 1.4 %
HCT: 45.9 % (ref 38.5–50.0)
Hemoglobin: 15.5 g/dL (ref 13.2–17.1)
Lymphs Abs: 2642 cells/uL (ref 850–3900)
MCH: 30 pg (ref 27.0–33.0)
MCHC: 33.8 g/dL (ref 32.0–36.0)
MCV: 89 fL (ref 80.0–100.0)
MPV: 11.1 fL (ref 7.5–12.5)
Monocytes Relative: 12.6 %
Neutro Abs: 1724 cells/uL (ref 1500–7800)
Neutrophils Relative %: 33.8 %
Platelets: 188 10*3/uL (ref 140–400)
RBC: 5.16 10*6/uL (ref 4.20–5.80)
RDW: 13.3 % (ref 11.0–15.0)
Total Lymphocyte: 51.8 %
WBC: 5.1 10*3/uL (ref 3.8–10.8)

## 2020-02-12 LAB — BASIC METABOLIC PANEL
BUN: 13 mg/dL (ref 7–25)
CO2: 26 mmol/L (ref 20–32)
Calcium: 9.1 mg/dL (ref 8.6–10.3)
Chloride: 105 mmol/L (ref 98–110)
Creat: 0.87 mg/dL (ref 0.60–1.35)
Glucose, Bld: 88 mg/dL (ref 65–99)
Potassium: 4.3 mmol/L (ref 3.5–5.3)
Sodium: 138 mmol/L (ref 135–146)

## 2020-02-12 LAB — LIPID PANEL
Cholesterol: 171 mg/dL (ref <200)
HDL: 37 mg/dL — ABNORMAL LOW (ref >=40)
LDL Cholesterol (Calc): 114 mg/dL (calc) — ABNORMAL HIGH
Non-HDL Cholesterol (Calc): 134 mg/dL (calc) — ABNORMAL HIGH (ref <130)
Total CHOL/HDL Ratio: 4.6 (calc) (ref <5.0)
Triglycerides: 98 mg/dL (ref <150)

## 2020-02-12 LAB — HIV-1 RNA QUANT-NO REFLEX-BLD
HIV 1 RNA Quant: <20 copies/mL
HIV-1 RNA Quant, Log: <1.3 Log copies/mL

## 2020-02-24 ENCOUNTER — Encounter: Payer: Self-pay | Admitting: Internal Medicine

## 2020-02-24 ENCOUNTER — Ambulatory Visit (INDEPENDENT_AMBULATORY_CARE_PROVIDER_SITE_OTHER): Payer: Medicaid Other | Admitting: Internal Medicine

## 2020-02-24 ENCOUNTER — Other Ambulatory Visit: Payer: Self-pay

## 2020-02-24 VITALS — BP 139/92 | HR 67 | Wt 247.0 lb

## 2020-02-24 DIAGNOSIS — Z716 Tobacco abuse counseling: Secondary | ICD-10-CM

## 2020-02-24 DIAGNOSIS — R635 Abnormal weight gain: Secondary | ICD-10-CM

## 2020-02-24 DIAGNOSIS — B20 Human immunodeficiency virus [HIV] disease: Secondary | ICD-10-CM

## 2020-02-24 DIAGNOSIS — Z23 Encounter for immunization: Secondary | ICD-10-CM

## 2020-02-24 NOTE — Progress Notes (Signed)
Patient ID: John Dixon, male   DOB: 1976-03-10, 44 y.o.   MRN: [id number]  HPI 44yo M with well controlled hiv disease, CD 4 count 1113/VL<20 in 02-09-21on genvoya. Grieving loss of brother from unexpected death in 01/18/23. He continues to have great adherence with genvoya. Starting to get back to his regular life.   Sochx: 1 pack per week, marijuana daily   Outpatient Encounter Medications as of 02/24/2020  Medication Sig  . GENVOYA 150-150-200-10 MG TABS tablet TAKE 1 TABLET BY MOUTH DAILY WITH BREAKFAST  . rosuvastatin (CRESTOR) 10 MG tablet TAKE 1 TABLET(10 MG) BY MOUTH DAILY   No facility-administered encounter medications on file as of 02/24/2020.     Patient Active Problem List   Diagnosis Date Noted  . Asymptomatic HIV infection ([facility]) 09/22/2014     There are no preventive care reminders to display for this patient.   Review of Systems  Constitutional: Negative for fever, chills, diaphoresis, activity change, appetite change, fatigue and unexpected weight change.  HENT: Negative for congestion, sore throat, rhinorrhea, sneezing, trouble swallowing and sinus pressure.  Eyes: Negative for photophobia and visual disturbance.  Respiratory: Negative for cough, chest tightness, shortness of breath, wheezing and stridor.  Cardiovascular: Negative for chest pain, palpitations and leg swelling.  Gastrointestinal: Negative for nausea, vomiting, abdominal pain, diarrhea, constipation, blood in stool, abdominal distention and anal bleeding.  Genitourinary: Negative for dysuria, hematuria, flank pain and difficulty urinating.  Musculoskeletal: Negative for myalgias, back pain, joint swelling, arthralgias and gait problem.  Skin: Negative for color change, pallor, rash and wound.  Neurological: Negative for dizziness, tremors, weakness and light-headedness.  Hematological: Negative for adenopathy. Does not bruise/bleed easily.  Psychiatric/Behavioral: +grief    Physical  Exam   BP (!) 141/93   Pulse 69   Wt 247 lb (112 kg)   BMI 33.50 kg/m   Physical Exam  Constitutional: He is oriented to person, place, and time. He appears well-developed and well-nourished. No distress.  HENT:  Mouth/Throat: Oropharynx is clear and moist. No oropharyngeal exudate.  Cardiovascular: Normal rate, regular rhythm and normal heart sounds. Exam reveals no gallop and no friction rub.  No murmur heard.  Pulmonary/Chest: Effort normal and breath sounds normal. No respiratory distress. He has no wheezes.  Abdominal: Soft. Bowel sounds are normal. He exhibits no distension. There is no tenderness.  Lymphadenopathy:  He has no cervical adenopathy.  Neurological: He is alert and oriented to person, place, and time.  Skin: Skin is warm and dry. No rash noted. No erythema.  Psychiatric: He has a normal mood and affect. His behavior is normal.    Lab Results  Component Value Date   CD4TCELL 42 02/10/2020   Lab Results  Component Value Date   CD4TABS 1,113 02/10/2020   CD4TABS 1,130 08/20/2019   CD4TABS 1,120 01/28/2019   Lab Results  Component Value Date   HIV1RNAQUANT <20 NOT DETECTED 02/10/2020   Lab Results  Component Value Date   HEPBSAB NEG 09/02/2014   Lab Results  Component Value Date   LABRPR NON-REACTIVE 08/20/2019    CBC Lab Results  Component Value Date   WBC 5.1 02/10/2020   RBC 5.16 02/10/2020   HGB 15.5 02/10/2020   HCT 45.9 02/10/2020   PLT 188 02/10/2020   MCV 89.0 02/10/2020   MCH 30.0 02/10/2020   MCHC 33.8 02/10/2020   RDW 13.3 02/10/2020   LYMPHSABS 2,642 02/10/2020   MONOABS 440 08/13/2017   EOSABS 71 02/10/2020  BMET Lab Results  Component Value Date   NA 138 02/10/2020   K 4.3 02/10/2020   CL 105 02/10/2020   CO2 26 02/10/2020   GLUCOSE 88 02/10/2020   BUN 13 02/10/2020   CREATININE 0.87 02/10/2020   CALCIUM 9.1 02/10/2020   GFRNONAA 95 08/20/2019   GFRAA 110 08/20/2019      Assessment and Plan hiv disease =  well controlled, on genvoya.   Smoking cessation = education to decrease smoking  Health promotion = Booster hpv, pneumovax now covid vaccine in 2 wk  Weight gain = wants to do diet modification to try to lose weight

## 2020-02-24 NOTE — Patient Instructions (Signed)
Call at the end of march to schedule your covid vaccine. Appointments open on mondays: ------------------------------------- COVID-19 Vaccine Information can be found at: [email or web address] For questions related to vaccine distribution or appointments, please email [email or web address] or call [phone].    ----------------------- [employer], [place], covid vaccine site # [phone])[phone] --------------------  If you google [facility] covid vaccine appointment - website will show you how to register online for appointment  -----------------------

## 2020-03-01 ENCOUNTER — Other Ambulatory Visit: Payer: Self-pay

## 2020-03-01 DIAGNOSIS — B20 Human immunodeficiency virus [HIV] disease: Secondary | ICD-10-CM

## 2020-03-01 MED ORDER — GENVOYA 150-150-200-10 MG PO TABS: 1.0000 | ORAL_TABLET | Freq: Every day | ORAL | 6 refills | Status: DC

## 2020-07-14 ENCOUNTER — Telehealth: Payer: Self-pay | Admitting: *Deleted

## 2020-07-14 NOTE — Telephone Encounter (Signed)
Patient left message with his new phone number. RN updated chart. Andree Coss, RN

## 2020-07-21 ENCOUNTER — Other Ambulatory Visit: Payer: Self-pay | Admitting: Internal Medicine

## 2020-08-18 ENCOUNTER — Other Ambulatory Visit: Payer: Medicaid Other

## 2020-09-01 ENCOUNTER — Encounter: Payer: Medicaid Other | Admitting: Internal Medicine

## 2020-09-07 ENCOUNTER — Other Ambulatory Visit: Payer: Self-pay

## 2020-09-07 DIAGNOSIS — B20 Human immunodeficiency virus [HIV] disease: Secondary | ICD-10-CM

## 2020-09-07 DIAGNOSIS — Z113 Encounter for screening for infections with a predominantly sexual mode of transmission: Secondary | ICD-10-CM

## 2020-09-08 ENCOUNTER — Other Ambulatory Visit: Payer: Self-pay

## 2020-09-08 ENCOUNTER — Other Ambulatory Visit: Payer: Medicaid Other

## 2020-09-08 ENCOUNTER — Other Ambulatory Visit (HOSPITAL_COMMUNITY)
Admission: RE | Admit: 2020-09-08 | Discharge: 2020-09-08 | Disposition: A | Discharge status: Home / Self Care | Payer: Medicaid Other | Source: Ambulatory Visit | Attending: Internal Medicine | Admitting: Internal Medicine

## 2020-09-08 DIAGNOSIS — Z113 Encounter for screening for infections with a predominantly sexual mode of transmission: Secondary | ICD-10-CM | POA: Diagnosis not present

## 2020-09-08 DIAGNOSIS — B20 Human immunodeficiency virus [HIV] disease: Secondary | ICD-10-CM | POA: Diagnosis not present

## 2020-09-09 LAB — URINE CYTOLOGY ANCILLARY ONLY
Chlamydia: NEGATIVE
Comment: NEGATIVE
Comment: NORMAL
Neisseria Gonorrhea: NEGATIVE

## 2020-09-09 LAB — T-HELPER CELL (CD4) - (RCID CLINIC ONLY)
CD4 % Helper T Cell: 38 % (ref 33–65)
CD4 T Cell Abs: 1168 /uL (ref 400–1790)

## 2020-09-11 LAB — COMPLETE METABOLIC PANEL WITH GFR
AG Ratio: 1.8 (calc) (ref 1.0–2.5)
ALT: 32 U/L (ref 9–46)
AST: 22 U/L (ref 10–40)
Albumin: 4.4 g/dL (ref 3.6–5.1)
Alkaline phosphatase (APISO): 63 U/L (ref 36–130)
BUN: 13 mg/dL (ref 7–25)
CO2: 28 mmol/L (ref 20–32)
Calcium: 9.1 mg/dL (ref 8.6–10.3)
Chloride: 105 mmol/L (ref 98–110)
Creat: 0.94 mg/dL (ref 0.60–1.35)
GFR, Est African American: 116 mL/min/{1.73_m2} (ref >=60)
GFR, Est Non African American: 100 mL/min/{1.73_m2} (ref >=60)
Globulin: 2.4 g/dL (calc) (ref 1.9–3.7)
Glucose, Bld: 158 mg/dL — ABNORMAL HIGH (ref 65–99)
Potassium: 4.3 mmol/L (ref 3.5–5.3)
Sodium: 139 mmol/L (ref 135–146)
Total Bilirubin: 0.4 mg/dL (ref 0.2–1.2)
Total Protein: 6.8 g/dL (ref 6.1–8.1)

## 2020-09-11 LAB — CBC WITH DIFFERENTIAL/PLATELET
Absolute Monocytes: 585 cells/uL (ref 200–950)
Basophils Absolute: 20 cells/uL (ref 0–200)
Basophils Relative: 0.3 %
Eosinophils Absolute: 39 cells/uL (ref 15–500)
Eosinophils Relative: 0.6 %
HCT: 46.1 % (ref 38.5–50.0)
Hemoglobin: 15.7 g/dL (ref 13.2–17.1)
Lymphs Abs: 3263 cells/uL (ref 850–3900)
MCH: 30.5 pg (ref 27.0–33.0)
MCHC: 34.1 g/dL (ref 32.0–36.0)
MCV: 89.7 fL (ref 80.0–100.0)
MPV: 11.2 fL (ref 7.5–12.5)
Monocytes Relative: 9 %
Neutro Abs: 2594 cells/uL (ref 1500–7800)
Neutrophils Relative %: 39.9 %
Platelets: 195 10*3/uL (ref 140–400)
RBC: 5.14 10*6/uL (ref 4.20–5.80)
RDW: 13.4 % (ref 11.0–15.0)
Total Lymphocyte: 50.2 %
WBC: 6.5 10*3/uL (ref 3.8–10.8)

## 2020-09-11 LAB — HIV-1 RNA QUANT-NO REFLEX-BLD
HIV 1 RNA Quant: <20 Copies/mL
HIV-1 RNA Quant, Log: <1.3 Log cps/mL

## 2020-09-11 LAB — RPR: RPR Ser Ql: NONREACTIVE

## 2020-09-13 ENCOUNTER — Other Ambulatory Visit: Payer: Self-pay | Admitting: Internal Medicine

## 2020-09-13 DIAGNOSIS — B20 Human immunodeficiency virus [HIV] disease: Secondary | ICD-10-CM

## 2020-09-22 ENCOUNTER — Encounter: Payer: Medicaid Other | Admitting: Internal Medicine

## 2020-09-27 ENCOUNTER — Other Ambulatory Visit: Payer: Self-pay

## 2020-09-27 ENCOUNTER — Ambulatory Visit (INDEPENDENT_AMBULATORY_CARE_PROVIDER_SITE_OTHER): Payer: Medicaid Other | Admitting: Internal Medicine

## 2020-09-27 ENCOUNTER — Encounter: Payer: Self-pay | Admitting: Internal Medicine

## 2020-09-27 VITALS — BP 145/90 | HR 78 | Temp 97.5°F | Wt 257.0 lb

## 2020-09-27 DIAGNOSIS — Z23 Encounter for immunization: Secondary | ICD-10-CM | POA: Diagnosis not present

## 2020-09-27 DIAGNOSIS — E7849 Other hyperlipidemia: Secondary | ICD-10-CM | POA: Diagnosis not present

## 2020-09-27 DIAGNOSIS — Z79899 Other long term (current) drug therapy: Secondary | ICD-10-CM | POA: Diagnosis not present

## 2020-09-27 DIAGNOSIS — B20 Human immunodeficiency virus [HIV] disease: Secondary | ICD-10-CM

## 2020-09-27 MED ORDER — GENVOYA 150-150-200-10 MG PO TABS: 1.0000 | ORAL_TABLET | Freq: Every day | ORAL | 11 refills | Status: AC

## 2020-09-27 MED ORDER — ROSUVASTATIN CALCIUM 10 MG PO TABS: 10.0000 mg | ORAL_TABLET | Freq: Every day | ORAL | 11 refills | Status: AC

## 2020-09-27 NOTE — Progress Notes (Signed)
RFV: follow up for hiv disease  Patient ID: John Dixon, male   DOB: [birth year], 44 y.o.   MRN: [id number]  HPI John Dixon is a 44yo M with hiv disease, CD 4 count of 1168/VL<20 (sep 2021) on genvoya. And HLD --he reports that he is still occasionally sad/mourning the passing of his brother, nearly at 1 year mark.  Nephew 16 and niece 27 yos -- he is playing bigger part in their lives since his brother passed away. Thinking about celebration fo life for his brother's 1 year [place] of death.   Don't take anything for granted.  Hasn't had covid-19 -- but not necessarily interested in getting vaccine  Outpatient Encounter Medications as of 09/27/2020  Medication Sig  . GENVOYA 150-150-200-10 MG TABS tablet TAKE 1 TABLET BY MOUTH DAILY WITH BREAKFAST  . rosuvastatin (CRESTOR) 10 MG tablet TAKE 1 TABLET(10 MG) BY MOUTH DAILY   No facility-administered encounter medications on file as of 09/27/2020.     Patient Active Problem List   Diagnosis Date Noted  . Asymptomatic HIV infection (HCC) 09/22/2014     Health Maintenance Due  Topic Date Due  . COVID-19 Vaccine (1) Never done  . INFLUENZA VACCINE  07/10/2020     Review of Systems Review of Systems  Constitutional: Negative for fever, chills, diaphoresis, activity change, appetite change, fatigue and unexpected weight change.  HENT: Negative for congestion, sore throat, rhinorrhea, sneezing, trouble swallowing and sinus pressure.  Eyes: Negative for photophobia and visual disturbance.  Respiratory: Negative for cough, chest tightness, shortness of breath, wheezing and stridor.  Cardiovascular: Negative for chest pain, palpitations and leg swelling.  Gastrointestinal: Negative for nausea, vomiting, abdominal pain, diarrhea, constipation, blood in stool, abdominal distention and anal bleeding.  Genitourinary: Negative for dysuria, hematuria, flank pain and difficulty urinating.  Musculoskeletal: Negative for myalgias, back pain,  joint swelling, arthralgias and gait problem.  Skin: Negative for color change, pallor, rash and wound.  Neurological: Negative for dizziness, tremors, weakness and light-headedness.  Hematological: Negative for adenopathy. Does not bruise/bleed easily.  Psychiatric/Behavioral: Negative for behavioral problems, confusion, sleep disturbance, dysphoric mood, decreased concentration and agitation.    Physical Exam   Wt 257 lb (116.6 kg)   BMI 34.86 kg/m   Physical Exam  Constitutional: He is oriented to person, place, and time. He appears well-developed and well-nourished. No distress.  HENT:  Mouth/Throat: Oropharynx is clear and moist. No oropharyngeal exudate.  Cardiovascular: Normal rate, regular rhythm and normal heart sounds. Exam reveals no gallop and no friction rub.  No murmur heard.  Pulmonary/Chest: Effort normal and breath sounds normal. No respiratory distress. He has no wheezes.  Lymphadenopathy:  He has no cervical adenopathy.  Neurological: He is alert and oriented to person, place, and time.  Skin: Skin is warm and dry. No rash noted. No erythema.  Psychiatric: He has a normal mood and affect. His behavior is normal.    Lab Results  Component Value Date   CD4TCELL 38 09/08/2020   Lab Results  Component Value Date   CD4TABS 1,168 09/08/2020   CD4TABS 1,113 02/10/2020   CD4TABS 1,130 08/20/2019   Lab Results  Component Value Date   HIV1RNAQUANT <20 09/08/2020   Lab Results  Component Value Date   HEPBSAB NEG 09/02/2014   Lab Results  Component Value Date   LABRPR NON-REACTIVE 09/08/2020    CBC Lab Results  Component Value Date   WBC 6.5 09/08/2020   RBC 5.14 09/08/2020   HGB 15.7 09/08/2020  HCT 46.1 09/08/2020   PLT 195 09/08/2020   MCV 89.7 09/08/2020   MCH 30.5 09/08/2020   MCHC 34.1 09/08/2020   RDW 13.4 09/08/2020   LYMPHSABS 3,263 09/08/2020   MONOABS 440 08/13/2017   EOSABS 39 09/08/2020    BMET Lab Results  Component Value Date     NA 139 09/08/2020   K 4.3 09/08/2020   CL 105 09/08/2020   CO2 28 09/08/2020   GLUCOSE 158 (H) 09/08/2020   BUN 13 09/08/2020   CREATININE 0.94 09/08/2020   CALCIUM 9.1 09/08/2020   GFRNONAA 100 09/08/2020   GFRAA 116 09/08/2020      Assessment and Plan  Health maintenance = had flu vaccine today. Deferred covid vaccine  hiv disease = well controlled, continue on genvoya  Long term medication management= cr stable  Hld= continue on rosuvastatin

## 2020-09-28 ENCOUNTER — Other Ambulatory Visit: Payer: Self-pay | Admitting: Internal Medicine

## 2021-03-28 ENCOUNTER — Other Ambulatory Visit: Payer: Medicaid Other

## 2021-04-11 ENCOUNTER — Encounter: Payer: Medicaid Other | Admitting: Internal Medicine

## 2021-04-11 ENCOUNTER — Telehealth: Payer: Self-pay

## 2021-04-11 NOTE — Telephone Encounter (Signed)
Attempted to call patient to reschedule today's missed appointment, no answer. Left HIPAA compliant voicemail requesting callback.   Zyren Sevigny D Warren Lindahl, RN  

## 2021-04-11 NOTE — Telephone Encounter (Signed)
Voicemail left in triage at 3:11 pm from patient's cell phone number [phone]), but no message.  Andree Coss, RN

## 2021-05-03 ENCOUNTER — Telehealth: Payer: Self-pay

## 2021-05-03 NOTE — Telephone Encounter (Signed)
-----   Message from [facility] sent at 05/03/2021  2:43 PM EDT ----- Was calling patient to confirm appointment, patient said he was in the process of moving to [place] and once he found a clinic he would fill out a medical release but would need a refill on medication until he finds one. Patient did miss last lab and office visit. If you have any questions best contact number is [phone]

## 2021-05-03 NOTE — Telephone Encounter (Signed)
RN called patient, advised him that Dr. Drue Second sent a year's supply of his medication to the [facility] on [address] back in October 2021. Encouraged patient to reach out to [place] when he finds new provider so that we can assist in providing them with his medical records. Patient verbalized understanding and has no further questions.   Sandie Ano, RN

## 2021-05-04 ENCOUNTER — Ambulatory Visit: Payer: Medicaid Other | Admitting: Internal Medicine

## 2021-05-29 ENCOUNTER — Telehealth: Payer: Self-pay

## 2021-05-29 NOTE — Telephone Encounter (Signed)
Patient called stating he recently moved to [place] and is needing his [place] prescription. Patient stated he tried to fill it at [facility] in [place] and I explained to the patient that he has [place] Medicaid and the [place] could not be filled in [place]. Patient advised to call [facility] on [address] where he was previous getting his medication to see if they will be willing to mail it to him. Patient also advised he needs to make sure he find a ID doctor in [place] to manage his HIV and he will need to change his insurance.  John Dixon T Pricilla Loveless

## 2021-07-10 ENCOUNTER — Telehealth: Payer: Self-pay

## 2021-07-10 ENCOUNTER — Other Ambulatory Visit (HOSPITAL_COMMUNITY): Payer: Self-pay

## 2021-07-10 NOTE — Telephone Encounter (Signed)
Patient call stating he is having issues getting medication. Currently lives in [facility] and is waiting to be seen by new ID team. Is scheduled to follow up with them on 8/22. He has missed 2 days of medication. Pharmacy does not have insurance information for him. Lupita Leash with Pharmacy will apply for medication assistance on patient's behalf.  Patient is getting medication at [facility] in [place], [place]. Juanita Laster, RMA

## 2021-07-10 NOTE — Telephone Encounter (Signed)
I was able to get this patient enrolled in Advancing Access to cover his Genvoya.

## 2021-07-10 NOTE — Telephone Encounter (Signed)
[facility] Patient Advocate Encounter  Completed and sent [employer] Advancing Access application for Genvoya for this patient who is uninsured.    Patient is approved 07/10/21.        Sandon Yoho, CPhT [facility] Phone: [phone] Fax:  [phone]  

## 2021-07-11 ENCOUNTER — Other Ambulatory Visit (HOSPITAL_COMMUNITY): Payer: Self-pay

## 2021-07-11 MED ORDER — GENVOYA 150-150-200-10 MG PO TABS: 1.0000 | ORAL_TABLET | Freq: Every day | ORAL | 0 refills | Status: AC
